# Patient Record
Sex: Female | Born: 1960 | Hispanic: No | Smoking: Former smoker
Health system: Southern US, Community
[De-identification: ages and names within clinical notes are randomized; demographics above are authoritative.]

---

## 2012-03-11 DIAGNOSIS — H402290 Chronic angle-closure glaucoma, unspecified eye, stage unspecified: Secondary | ICD-10-CM | POA: Insufficient documentation

## 2019-04-19 DIAGNOSIS — H401124 Primary open-angle glaucoma, left eye, indeterminate stage: Secondary | ICD-10-CM | POA: Insufficient documentation

## 2019-05-02 DIAGNOSIS — R7989 Other specified abnormal findings of blood chemistry: Secondary | ICD-10-CM | POA: Insufficient documentation

## 2019-05-04 DIAGNOSIS — K219 Gastro-esophageal reflux disease without esophagitis: Secondary | ICD-10-CM | POA: Insufficient documentation

## 2019-05-05 DIAGNOSIS — K8020 Calculus of gallbladder without cholecystitis without obstruction: Secondary | ICD-10-CM | POA: Insufficient documentation

## 2022-03-14 ENCOUNTER — Emergency Department (HOSPITAL_BASED_OUTPATIENT_CLINIC_OR_DEPARTMENT_OTHER): Payer: Medicaid Other

## 2022-03-14 ENCOUNTER — Inpatient Hospital Stay (HOSPITAL_BASED_OUTPATIENT_CLINIC_OR_DEPARTMENT_OTHER)
Admission: EM | Admit: 2022-03-14 | Discharge: 2022-03-18 | DRG: 065 | Disposition: A | Discharge status: Rehabilitation Facility | Payer: Medicaid Other | Attending: Family Medicine | Admitting: Family Medicine

## 2022-03-14 ENCOUNTER — Other Ambulatory Visit: Payer: Self-pay

## 2022-03-14 ENCOUNTER — Encounter (HOSPITAL_BASED_OUTPATIENT_CLINIC_OR_DEPARTMENT_OTHER): Payer: Self-pay

## 2022-03-14 ENCOUNTER — Inpatient Hospital Stay (HOSPITAL_COMMUNITY): Payer: Medicaid Other

## 2022-03-14 DIAGNOSIS — R29712 NIHSS score 12: Secondary | ICD-10-CM | POA: Diagnosis present

## 2022-03-14 DIAGNOSIS — H5461 Unqualified visual loss, right eye, normal vision left eye: Secondary | ICD-10-CM | POA: Diagnosis present

## 2022-03-14 DIAGNOSIS — G8191 Hemiplegia, unspecified affecting right dominant side: Secondary | ICD-10-CM | POA: Diagnosis present

## 2022-03-14 DIAGNOSIS — R7401 Elevation of levels of liver transaminase levels: Secondary | ICD-10-CM | POA: Diagnosis present

## 2022-03-14 DIAGNOSIS — E785 Hyperlipidemia, unspecified: Secondary | ICD-10-CM | POA: Diagnosis present

## 2022-03-14 DIAGNOSIS — I161 Hypertensive emergency: Secondary | ICD-10-CM | POA: Diagnosis present

## 2022-03-14 DIAGNOSIS — E669 Obesity, unspecified: Secondary | ICD-10-CM | POA: Diagnosis not present

## 2022-03-14 DIAGNOSIS — H547 Unspecified visual loss: Secondary | ICD-10-CM

## 2022-03-14 DIAGNOSIS — Z9049 Acquired absence of other specified parts of digestive tract: Secondary | ICD-10-CM | POA: Diagnosis not present

## 2022-03-14 DIAGNOSIS — H4010X0 Unspecified open-angle glaucoma, stage unspecified: Secondary | ICD-10-CM | POA: Diagnosis not present

## 2022-03-14 DIAGNOSIS — Z87891 Personal history of nicotine dependence: Secondary | ICD-10-CM | POA: Diagnosis not present

## 2022-03-14 DIAGNOSIS — R270 Ataxia, unspecified: Secondary | ICD-10-CM | POA: Diagnosis present

## 2022-03-14 DIAGNOSIS — Z79899 Other long term (current) drug therapy: Secondary | ICD-10-CM | POA: Diagnosis not present

## 2022-03-14 DIAGNOSIS — H401120 Primary open-angle glaucoma, left eye, stage unspecified: Secondary | ICD-10-CM | POA: Diagnosis present

## 2022-03-14 DIAGNOSIS — Z7984 Long term (current) use of oral hypoglycemic drugs: Secondary | ICD-10-CM | POA: Diagnosis not present

## 2022-03-14 DIAGNOSIS — E119 Type 2 diabetes mellitus without complications: Secondary | ICD-10-CM | POA: Diagnosis present

## 2022-03-14 DIAGNOSIS — Z7983 Long term (current) use of bisphosphonates: Secondary | ICD-10-CM

## 2022-03-14 DIAGNOSIS — I629 Nontraumatic intracranial hemorrhage, unspecified: Principal | ICD-10-CM

## 2022-03-14 DIAGNOSIS — Z91148 Patient's other noncompliance with medication regimen for other reason: Secondary | ICD-10-CM

## 2022-03-14 DIAGNOSIS — I679 Cerebrovascular disease, unspecified: Secondary | ICD-10-CM | POA: Diagnosis not present

## 2022-03-14 DIAGNOSIS — R4701 Aphasia: Secondary | ICD-10-CM | POA: Diagnosis present

## 2022-03-14 DIAGNOSIS — R29810 Facial weakness: Secondary | ICD-10-CM | POA: Diagnosis present

## 2022-03-14 DIAGNOSIS — R471 Dysarthria and anarthria: Secondary | ICD-10-CM | POA: Diagnosis present

## 2022-03-14 DIAGNOSIS — I10 Essential (primary) hypertension: Secondary | ICD-10-CM | POA: Diagnosis present

## 2022-03-14 DIAGNOSIS — I610 Nontraumatic intracerebral hemorrhage in hemisphere, subcortical: Principal | ICD-10-CM | POA: Diagnosis present

## 2022-03-14 DIAGNOSIS — Z713 Dietary counseling and surveillance: Secondary | ICD-10-CM | POA: Diagnosis not present

## 2022-03-14 DIAGNOSIS — E1169 Type 2 diabetes mellitus with other specified complication: Secondary | ICD-10-CM | POA: Diagnosis not present

## 2022-03-14 DIAGNOSIS — I619 Nontraumatic intracerebral hemorrhage, unspecified: Secondary | ICD-10-CM | POA: Diagnosis not present

## 2022-03-14 DIAGNOSIS — R1312 Dysphagia, oropharyngeal phase: Secondary | ICD-10-CM | POA: Diagnosis not present

## 2022-03-14 DIAGNOSIS — Z8673 Personal history of transient ischemic attack (TIA), and cerebral infarction without residual deficits: Secondary | ICD-10-CM

## 2022-03-14 DIAGNOSIS — R1310 Dysphagia, unspecified: Secondary | ICD-10-CM | POA: Diagnosis present

## 2022-03-14 DIAGNOSIS — Z83511 Family history of glaucoma: Secondary | ICD-10-CM | POA: Diagnosis not present

## 2022-03-14 DIAGNOSIS — H543 Unqualified visual loss, both eyes: Secondary | ICD-10-CM | POA: Diagnosis not present

## 2022-03-14 DIAGNOSIS — I69298 Other sequelae of other nontraumatic intracranial hemorrhage: Secondary | ICD-10-CM | POA: Diagnosis not present

## 2022-03-14 LAB — CBC
HCT: 38 % (ref 36.0–46.0)
Hemoglobin: 12.6 g/dL (ref 12.0–15.0)
MCH: 31.4 pg (ref 26.0–34.0)
MCHC: 33.2 g/dL (ref 30.0–36.0)
MCV: 94.8 fL (ref 80.0–100.0)
Platelets: 396 10*3/uL (ref 150–400)
RBC: 4.01 MIL/uL (ref 3.87–5.11)
RDW: 11.9 % (ref 11.5–15.5)
WBC: 8.1 10*3/uL (ref 4.0–10.5)
nRBC: 0 % (ref 0.0–0.2)

## 2022-03-14 LAB — GLUCOSE, CAPILLARY
Glucose-Capillary: 142 mg/dL — ABNORMAL HIGH (ref 70–99)
Glucose-Capillary: 144 mg/dL — ABNORMAL HIGH (ref 70–99)

## 2022-03-14 LAB — DIFFERENTIAL
Abs Immature Granulocytes: 0.1 10*3/uL — ABNORMAL HIGH (ref 0.00–0.07)
Basophils Absolute: 0.1 10*3/uL (ref 0.0–0.1)
Basophils Relative: 1 %
Eosinophils Absolute: 0.1 10*3/uL (ref 0.0–0.5)
Eosinophils Relative: 2 %
Immature Granulocytes: 1 %
Lymphocytes Relative: 24 %
Lymphs Abs: 1.9 10*3/uL (ref 0.7–4.0)
Monocytes Absolute: 0.4 10*3/uL (ref 0.1–1.0)
Monocytes Relative: 5 %
Neutro Abs: 5.5 10*3/uL (ref 1.7–7.7)
Neutrophils Relative %: 67 %

## 2022-03-14 LAB — APTT: aPTT: 28 seconds (ref 24–36)

## 2022-03-14 LAB — COMPREHENSIVE METABOLIC PANEL
ALT: 51 U/L — ABNORMAL HIGH (ref 0–44)
AST: 57 U/L — ABNORMAL HIGH (ref 15–41)
Albumin: 3.8 g/dL (ref 3.5–5.0)
Alkaline Phosphatase: 102 U/L (ref 38–126)
Anion gap: 9 (ref 5–15)
BUN: 11 mg/dL (ref 8–23)
CO2: 25 mmol/L (ref 22–32)
Calcium: 9.3 mg/dL (ref 8.9–10.3)
Chloride: 105 mmol/L (ref 98–111)
Creatinine, Ser: 0.84 mg/dL (ref 0.44–1.00)
GFR, Estimated: >60 mL/min (ref >60)
Glucose, Bld: 222 mg/dL — ABNORMAL HIGH (ref 70–99)
Potassium: 3.6 mmol/L (ref 3.5–5.1)
Sodium: 139 mmol/L (ref 135–145)
Total Bilirubin: 0.5 mg/dL (ref 0.3–1.2)
Total Protein: 8.6 g/dL — ABNORMAL HIGH (ref 6.5–8.1)

## 2022-03-14 LAB — CBG MONITORING, ED: Glucose-Capillary: 212 mg/dL — ABNORMAL HIGH (ref 70–99)

## 2022-03-14 LAB — HEMOGLOBIN A1C
Hgb A1c MFr Bld: 5.7 % — ABNORMAL HIGH (ref 4.8–5.6)
Mean Plasma Glucose: 116.89 mg/dL

## 2022-03-14 LAB — HIV ANTIBODY (ROUTINE TESTING W REFLEX): HIV Screen 4th Generation wRfx: NONREACTIVE

## 2022-03-14 LAB — PROTIME-INR
INR: 0.9 (ref 0.8–1.2)
Prothrombin Time: 12.3 seconds (ref 11.4–15.2)

## 2022-03-14 MED ORDER — TIMOLOL MALEATE 0.5 % OP SOLN
1.0000 [drp] | Freq: Two times a day (BID) | OPHTHALMIC | Status: DC
  Administered 2022-03-14 – 2022-03-18 (×8): 1 [drp] via OPHTHALMIC
  Filled 2022-03-14: qty 5

## 2022-03-14 MED ORDER — LABETALOL HCL 5 MG/ML IV SOLN
20.0000 mg | Freq: Once | INTRAVENOUS | Status: DC
  Filled 2022-03-14: qty 4

## 2022-03-14 MED ORDER — ACETAMINOPHEN 160 MG/5ML PO SOLN: 650.0000 mg | ORAL | Status: DC | PRN

## 2022-03-14 MED ORDER — STROKE: EARLY STAGES OF RECOVERY BOOK
Freq: Once | Status: AC
  Filled 2022-03-14: qty 1

## 2022-03-14 MED ORDER — SENNOSIDES-DOCUSATE SODIUM 8.6-50 MG PO TABS
1.0000 | ORAL_TABLET | Freq: Two times a day (BID) | ORAL | Status: DC
  Administered 2022-03-17: 1 via ORAL
  Filled 2022-03-14 (×4): qty 1

## 2022-03-14 MED ORDER — IOHEXOL 350 MG/ML SOLN
100.0000 mL | Freq: Once | INTRAVENOUS | Status: AC | PRN
Start: 2022-03-14 — End: 2022-03-14
  Administered 2022-03-14: 100 mL via INTRAVENOUS

## 2022-03-14 MED ORDER — ACETAMINOPHEN 650 MG RE SUPP
650.0000 mg | RECTAL | Status: DC | PRN
  Administered 2022-03-15: 650 mg via RECTAL
  Filled 2022-03-14: qty 1

## 2022-03-14 MED ORDER — ACETAMINOPHEN 325 MG PO TABS: 650.0000 mg | ORAL_TABLET | ORAL | Status: DC | PRN

## 2022-03-14 MED ORDER — SODIUM CHLORIDE 0.9 % IV SOLN
INTRAVENOUS | Status: DC
  Administered 2022-03-17: 1000 mL via INTRAVENOUS

## 2022-03-14 MED ORDER — INSULIN ASPART 100 UNIT/ML IJ SOLN
0.0000 [IU] | INTRAMUSCULAR | Status: DC
  Administered 2022-03-14 – 2022-03-15 (×5): 2 [IU] via SUBCUTANEOUS
  Administered 2022-03-15: 3 [IU] via SUBCUTANEOUS
  Administered 2022-03-16 (×2): 2 [IU] via SUBCUTANEOUS
  Administered 2022-03-16: 3 [IU] via SUBCUTANEOUS
  Administered 2022-03-16: 2 [IU] via SUBCUTANEOUS
  Administered 2022-03-17: 3 [IU] via SUBCUTANEOUS
  Administered 2022-03-17 (×2): 2 [IU] via SUBCUTANEOUS
  Administered 2022-03-17 (×2): 3 [IU] via SUBCUTANEOUS
  Administered 2022-03-17 – 2022-03-18 (×3): 2 [IU] via SUBCUTANEOUS
  Administered 2022-03-18: 8 [IU] via SUBCUTANEOUS
  Administered 2022-03-18: 3 [IU] via SUBCUTANEOUS

## 2022-03-14 MED ORDER — PANTOPRAZOLE SODIUM 40 MG IV SOLR
40.0000 mg | Freq: Every day | INTRAVENOUS | Status: DC
  Administered 2022-03-14 – 2022-03-15 (×2): 40 mg via INTRAVENOUS
  Filled 2022-03-14 (×2): qty 10

## 2022-03-14 MED ORDER — CLEVIDIPINE BUTYRATE 0.5 MG/ML IV EMUL
0.0000 mg/h | INTRAVENOUS | Status: DC
  Administered 2022-03-14: 14 mg/h via INTRAVENOUS
  Administered 2022-03-14: 2 mg/h via INTRAVENOUS
  Administered 2022-03-14: 14 mg/h via INTRAVENOUS
  Administered 2022-03-15: 10 mg/h via INTRAVENOUS
  Administered 2022-03-15: 6 mg/h via INTRAVENOUS
  Administered 2022-03-15: 14 mg/h via INTRAVENOUS
  Filled 2022-03-14 (×6): qty 100

## 2022-03-14 NOTE — H&P (Signed)
Neurology H&P ? ?CC: Aphasia and right sided weakness  ? ?History is obtained from: Emergency Contact  ? ?HPI: Paula Combs is a 61 y.o. female with PMHx, DM, HTN, HLD, glaucoma c/b loss of vision in the right eye, POAG of the left eye, ACOM aneurysm s/p coil ? ?Unfortunately history is very limited as patient and patient's primary caregiver (daughter) both only speak to [name], a language that we do not have interpretation services for.  Son-in-law is able to provide some history.  He reports that she did have a stroke in 2000 02/2004 with some residual difficulty walking which had gradually improved.  He is unsure of the laterality of her weakness at that time. ? ?Yesterday the entire day she was talking normally until she went to bed around 7 or 8 PM.  This morning on awakening she was unable to talk properly.  She seemed to have more difficulty speaking herself than understanding what she was being asked to do by family.  She notes that her blood pressure medications have been adjusted recently and she is supposed to be taking 2 pills of a particular medication but she was only taking 1.  He is unsure of the details of her medication as his wife manages this ? ?Regarding other recent symptoms, he notes that may be her weight has been changing some recently but it is difficult to be sure whether it is increasing or decreasing or simply fluctuating.  He also notes that she had a fever last week but this is improved. ? ?LKW: 4/28 evening ?tPA given?: No, ICH ?Premorbid modified rankin scale: 2 - 3   ?    2 - Slight disability. Able to look after own affairs without assistance, but unable to carry out all previous activities. ?    3 - Moderate disability. Requires some help, but able to walk unassisted. ? ?ICH Score:  ? Time performed: 6 PM ?GCS: 13-15 is 0 points ?Infratentorial: No.. If yes, 1 point - 0  ?Volume: <30cc is 0 points  ?Age: 61 y.o.. >80 is 1 point - 0  ?Intraventricular extension is 1 point -  0 ? ?Score:0 ? ?A Score of 0 points has a 30 day mortality of 0%. Stroke. 2001 Apr;32(4):891-7. ? ? ? ?ROS: Unable to obtain due to altered mental status and language barrier ? ?Per [facility] note:  ?PAST MEDICAL HISTORY ?Past Medical History:  ?Diagnosis Date  ? Blind hypertensive eye, right  ? Brain aneurysm  ? Branch retinal vein occlusion of right eye  ? Cataract  ? Diabetes mellitus (HCC)  ? Dry eye syndrome, bilateral  ? Glaucoma  ? Hyperlipemia  ? Hypertension  ? Long term (current) use of oral hypoglycemic drugs  ? Meibomian gland dysfunction (MGD) of both eyes  ? Stroke [facility]) 2004  ?Hx of Stroke  ? Stroke [facility])   ? ?Past Surgical History:  ?Procedure Laterality Date  ? iridotomy Bilateral 2013  ?Hx of LPI OU  ? ROBOTIC CHOLECYSTECTOMY N/A 08/06/2020  ?Procedure: CHOLECYSTECTOMY ROBOTIC; Surgeon: Lanice Schwab, MD; Location: [facility] MAIN OR; Service: General; Laterality: N/A;  ? ?FAMILY HISTORY ?Family History  ?Problem Relation Age of Onset  ? Glaucoma Mother  ? Glaucoma Father  ? Macular degeneration Neg Hx  ? Diabetes Neg Hx  ? Blindness Neg Hx  ? ?Current Outpatient Medications on File Prior to Visit (Ophthalmic Drugs)  ?Medication Sig  ? brinzolamide-brimonidine (SIMBRINZA) 1-0.2 % ophthalmic suspension Place 1 drop into both eyes 3 times  daily.  ? latanoprost (XALATAN) 0.005 % ophthalmic solution INSTILL 1 DROP IN BOTH EYES EVERY NIGHT  ? timolol (TIMOPTIC) 0.5 % ophthalmic solution Place 1 drop into both eyes 2 times daily.  ? ?Current Outpatient Medications on File Prior to Visit (Other) as of December 2022 ?Medication Sig  ? ACCU-CHEK AVIVA PLUS METER Misc U UTD  ? ACCU-CHEK AVIVA PLUS TEST STRP Strp U 1 STRIP D  ? ACCU-CHEK SOFTCLIX LANCETS lancets USE TO TEST QD  ? alendronate (FOSAMAX) 70 MG tablet Take 70 mg by mouth every 7 days.  ? amLODIPine (NORVASC) 5 MG tablet Take 5 mg by mouth daily.  ? atorvastatin (LIPITOR) 40 MG tablet Take 40 mg by mouth daily.  ? JANUVIA 100 mg tablet Take 100 mg by  mouth daily.  ? metFORMIN (GLUCOPHAGE) 1000 MG tablet Take 1,000 mg by mouth 2 times daily with meals.  ? omeprazole (PRILOSEC) 40 MG capsule Take 40 mg by mouth daily.  ? psyllium (METAMUCIL) 3.4 gram Take 1 packet by mouth daily for 5 days.   ?-We will need to be confirmed with family once wife is available (not available at the time of my conversation) ? ?Per preliminary pharmacy investigation  ?Current Outpatient Medications  ?Medication Instructions  ? alendronate (FOSAMAX) 70 mg, Oral, Weekly  ? atorvastatin (LIPITOR) 40 mg, Oral, Daily  ? irbesartan (AVAPRO) 75 mg, Oral, Daily  ? Januvia 100 mg, Oral, Daily  ? metFORMIN (GLUCOPHAGE) 1,000 mg, Oral, 2 times daily  ? omeprazole (PRILOSEC) 40 mg, Oral, Daily  ? timolol (TIMOPTIC) 0.5 % ophthalmic solution SMARTSIG:In Eye(s)  ? ? ? ?Social History: former smoker, quit 2015 ? ?Exam: ?Current vital signs: ?BP 136/80   Pulse (!) 106   Temp 97.9 ?F (36.6 ?C)   Resp 18   Ht 4\' 11"  (1.499 m)   Wt 64.4 kg   SpO2 99%   BMI 28.68 kg/m?  ?Vital signs in last 24 hours: ?Temp:  [97.9 ?F (36.6 ?C)] 97.9 ?F (36.6 ?C) (04/29 1721) ?Pulse Rate:  [76-121] 106 (04/29 1815) ?Resp:  [14-18] 18 (04/29 1815) ?BP: (136-206)/(80-114) 136/80 (04/29 1815) ?SpO2:  [93 %-100 %] 99 % (04/29 1815) ?Weight:  [64.4 kg] 64.4 kg (04/29 1611) ? ? ?Physical Exam  ?Constitutional: Appears well-developed and well-nourished.  ?Psych: Affect appropriate to situation, calm, slightly sleepy ?Eyes: Fixed and dilated right pupil ?HENT: No oropharyngeal obstruction.  ?MSK: no joint deformities.  ?Cardiovascular: Mildly tachycardic in the low 100s, regular rhythm, perfusing extremities well ?Respiratory: Effort normal, non-labored breathing ?GI: Soft.  No distension. There is no tenderness.  ?Skin: Warm dry and intact visible skin ? ?Neuro: ?Mental Status: ?Evaluation extremely limited by language barrier.  Alert.  Mimics examiner at times.  No verbal output ?Cranial Nerves: ?II: Blind right eye with  fixed and dilated pupil.  Left pupil is postsurgical but reactive sluggish 4 mm to 3 mm.  Appears to orient to stimuli in all 4 quadrants ?III,IV, VI: Tracks examiner in all quadrants. ?V: Facial sensation is symmetric to light eyelash brush ?VII: Facial movement is notable for right facial droop.  ?VIII: hearing is intact to voice ?Remainder unable to assess given mental status ?Motor: ?Able to maintain bilateral upper extremities without drift, though there is some slight weakness of the right upper extremity compared to the left.  Does not maintain either of her legs antigravity, though again her right leg appears to be weaker than the left ?Sensory: ?Appears equally reactive to tickle in all 4  extremities ?Cerebellar: ?Unable to assess secondary to patient's mental status  ?Gait:  ?Deferred in acute setting  ? ?NIHSS total 12 ?Score breakdown: 2 points for not answering questions, 2 points for not following commands (though no language barrier), 2 points for right facial droop, 3 points for left leg weakness, 3 points for right leg weakness ?Performed within 20 minutes of patient arrival to ICU ? ? ?I have reviewed labs in epic and the results pertinent to this consultation are: ? ?Basic Metabolic Panel: ?Recent Labs  ?Lab 03/14/22 ?1445  ?NA 139  ?K 3.6  ?CL 105  ?CO2 25  ?GLUCOSE 222*  ?BUN 11  ?CREATININE 0.84  ?CALCIUM 9.3  ? ? ?CBC: ?Recent Labs  ?Lab 03/14/22 ?1445  ?WBC 8.1  ?NEUTROABS 5.5  ?HGB 12.6  ?HCT 38.0  ?MCV 94.8  ?PLT 396  ? ? ?Coagulation Studies: ?Recent Labs  ?  03/14/22 ?1445  ?LABPROT 12.3  ?INR 0.9  ?  ? ? ?I have reviewed personally the images obtained: ? ?Agree with radiology: ?Acute parenchymal hemorrhage involving the left basal ganglia with ?mild edema. No intraventricular extension or significant mass ?effect. No abnormal vascularity in the region of hemorrhage. ?   ?Coiled anterior communicating artery region aneurysm. No definite ?residual or recurrent aneurysm. ?  ?No  hemodynamically significant stenosis in the neck. ?  ?Plaque causing mild to moderate stenosis of the left M1/M2 MCA as ?the vessel curves posteriorly. ?  ?Nonspecific extensive paranasal sinus opacification and bilateral ?patchy

## 2022-03-14 NOTE — ED Notes (Addendum)
On assessment, family is at bedside. Pt is slow to respond but will follow simple commands. Pt will move legs and arms, squeeze my hand with equal grip. Symptoms noted at this time is Pt is mute and obvious right sided facial droop is noted. Interpretor for [name] is not available at this time. Family reports LKN last night before bed. Family states she woke up this morning "looking different". Family is poor historian, unknown if pt is on blood thinners but reports hx of stroke, unsure of baseline. ? ?Unable to perform a swallow study at this time due to Pt not following command.  ?

## 2022-03-14 NOTE — ED Triage Notes (Signed)
Pt arrives pov, to triage in wheelchair, son-in-law reports facial droop and slurred speech upon waking at 1000 today. Last normal last night, before midnight. ?Interpreter unavailable for [name] language ?

## 2022-03-14 NOTE — ED Provider Notes (Signed)
?[facility] EMERGENCY DEPARTMENT ?Provider Note ? ? ?CSN: [id number] ?Arrival date & time: 03/14/22  1359 ? ?  ? ?History ? ?Chief Complaint  ?Patient presents with  ? Facial Droop  ? ? ?Paula Combs Record is a 61 y.o. female here presenting with facial droop and trouble speaking.  Her last normal was 10 PM last night when she went to bed.  She got up around 8 AM and did not feel right.  Patient states that she has trouble speaking.  The daughter woke up and talk to her around 11 am or 12 PM and her speech was slurred at that time.  She did not contact anybody between 8 AM and 11 AM when she talked to her daughter.  Her daughter then woke up her son-in-law to bring her to the ER.  She does not speak any [place] and son-in-law is at bedside translating.  He states that her speech is so slurred that he does not even understand her that well.  She had a stroke many years ago but not on any blood thinners right now ? ? ?The history is provided by the patient.  ? ?  ? ?Home Medications ?Prior to Admission medications   ?Not on File  ?   ? ?Allergies    ?Patient has no allergy information on record.   ? ?Review of Systems   ?Review of Systems  ?Neurological:  Positive for speech difficulty and weakness.  ?All other systems reviewed and are negative. ? ?Physical Exam ?Updated Vital Signs ?BP (!) 163/99   Pulse 85   Temp 97.9 ?F (36.6 ?C) (Oral)   Resp 18   Ht 4\' 11"  (1.499 m)   Wt 64.4 kg   SpO2 96%   BMI 28.68 kg/m?  ?Physical Exam ?Vitals and nursing note reviewed.  ?Constitutional:   ?   Comments: Confused  ?HENT:  ?   Head: Normocephalic.  ?   Nose: Nose normal.  ?   Mouth/Throat:  ?   Mouth: Mucous membranes are moist.  ?Eyes:  ?   Extraocular Movements: Extraocular movements intact.  ?   Pupils: Pupils are equal, round, and reactive to light.  ?Cardiovascular:  ?   Rate and Rhythm: Normal rate and regular rhythm.  ?   Pulses: Normal pulses.  ?   Heart sounds: Normal heart sounds.  ?Pulmonary:  ?   Effort:  Pulmonary effort is normal.  ?   Breath sounds: Normal breath sounds.  ?Abdominal:  ?   General: Abdomen is flat.  ?   Palpations: Abdomen is soft.  ?Musculoskeletal:     ?   General: Normal range of motion.  ?   Cervical back: Normal range of motion and neck supple.  ?Skin: ?   General: Skin is warm.  ?   Capillary Refill: Capillary refill takes less than 2 seconds.  ?Neurological:  ?   Comments: Obvious right facial droop.  Patient does have a slurred speech.  Difficult to get a good neuro exam due to language barrier and difficulty following commands.  Patient appears to have 4 out of 5 right arm and leg strength and 5 out of 5 left arm and leg strength.  Difficult to assess finger-to-nose  ?Psychiatric:     ?   Mood and Affect: Mood normal.     ?   Behavior: Behavior normal.     ?   Thought Content: Thought content normal.  ? ? ?ED Results / Procedures / Treatments   ?  Labs ?(all labs ordered are listed, but only abnormal results are displayed) ?Labs Reviewed  ?DIFFERENTIAL - Abnormal; Notable for the following components:  ?    Result Value  ? Abs Immature Granulocytes 0.10 (*)   ? All other components within normal limits  ?COMPREHENSIVE METABOLIC PANEL - Abnormal; Notable for the following components:  ? Glucose, Bld 222 (*)   ? Total Protein 8.6 (*)   ? AST 57 (*)   ? ALT 51 (*)   ? All other components within normal limits  ?CBG MONITORING, ED - Abnormal; Notable for the following components:  ? Glucose-Capillary 212 (*)   ? All other components within normal limits  ?PROTIME-INR  ?APTT  ?CBC  ?CBG MONITORING, ED  ? ? ?EKG ?EKG Interpretation ? ?Date/Time:  Saturday March 14 2022 14:14:06 EDT ?Ventricular Rate:  84 ?PR Interval:  184 ?QRS Duration: 86 ?QT Interval:  398 ?QTC Calculation: 470 ?R Axis:   -40 ?Text Interpretation: Normal sinus rhythm Left axis deviation Nonspecific T wave abnormality Abnormal ECG No previous ECGs available Confirmed by Richardean Canal [phone]) on 03/14/2022 2:55:55  PM ? ?Radiology ?CT ANGIO HEAD NECK W WO CM ? ?Result Date: 03/14/2022 ?CLINICAL DATA:  Dizziness, persistent/recurrent, cardiac or vascular cause suspected Stroke/TIA, determine embolic source EXAM: CT ANGIOGRAPHY HEAD AND NECK TECHNIQUE: Multidetector CT imaging of the head and neck was performed using the standard protocol during bolus administration of intravenous contrast. Multiplanar CT image reconstructions and MIPs were obtained to evaluate the vascular anatomy. Carotid stenosis measurements (when applicable) are obtained utilizing NASCET criteria, using the distal internal carotid diameter as the denominator. RADIATION DOSE REDUCTION: This exam was performed according to the departmental dose-optimization program which includes automated exposure control, adjustment of the Zierra and/or kV according to patient size and/or use of iterative reconstruction technique. CONTRAST:  OMNIPAQUE IOHEXOL 350 MG/ML SOLN COMPARISON:  None. FINDINGS: CT HEAD Brain: Acute parenchymal hemorrhage centered within the left lentiform nucleus measuring about 2.1 x 1.5 x 2.1 cm. There is some ill-defined hemorrhage tracking superiorly toward the caudate. Mild surrounding edema. No significant mass effect. No evidence of intraventricular extension. Prominence of ventricles and sulci reflects parenchymal volume loss. Patchy and confluent hypoattenuation in the supratentorial white matter is nonspecific but probably reflects moderate chronic microvascular ischemic changes. Vascular: Streak artifact form coil embolization in the anterior communicating artery region. Skull: Unremarkable. Sinuses/Orbits: Partially imaged extensive paranasal sinus opacification with chronic maxillary mucoperiosteal thickening. Unremarkable orbits. Other: Bilateral patchy mastoid and middle ear opacification. Review of the MIP images confirms the above findings CTA NECK Aortic arch: Trace calcified plaque. Great vessel origins are patent Right carotid  system: Patent.  No stenosis. Left carotid system: Patent.  No stenosis. Vertebral arteries: Patent and codominant.  No stenosis. Skeleton: Degenerative changes at C5-C6 and C6-C7. Other neck: Unremarkable. Upper chest: No apical lung mass. Review of the MIP images confirms the above findings CTA HEAD Anterior circulation: Intracranial internal carotid arteries are patent. Anterior cerebral arteries are patent. 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 Anterior communicating artery region is partially obscured by artifact. No definite residual or recurrent aneurysm. Middle cerebral arteries are patent. There is eccentric noncalcified plaque along the left M1/M2 MCA as it curves posteriorly causing mild to moderate stenosis. There is no abnormal vascularity in the region hemorrhage. Posterior circulation: Intracranial vertebral arteries, basilar artery, and posterior cerebral arteries are patent. Venous sinuses: Patent as allowed by contrast bolus timing. Review of the MIP  images confirms the above findings IMPRESSION: Acute parenchymal hemorrhage involving the left basal ganglia with mild edema. No intraventricular extension or significant mass effect. No abnormal vascularity in the region of hemorrhage. These results were called by telephone at the time of interpretation on 03/14/2022 at 4:02 pm to provider Earsie Humm , who verbally acknowledged these results. Coiled anterior communicating artery region aneurysm. No definite residual or recurrent aneurysm. No hemodynamically significant stenosis in the neck. Plaque causing mild to moderate stenosis of the left M1/M2 MCA as the vessel curves posteriorly. Nonspecific extensive paranasal sinus opacification and bilateral patchy mastoid and middle ear opacification. Electronically Signed   By: Guadlupe SpanishPraneil  Patel M.D.   On: 03/14/2022 16:12   ? ?Procedures ?Procedures  ? ? ?CRITICAL CARE ?Performed by: Richardean Canalavid H Harshita Bernales ? ? ?Total critical care time: 30  minutes ? ?Critical care time was exclusive of separately billable procedures and treating other patients. ? ?Critical care was necessary to treat or prevent imminent or life-threatening deterioration. ? ?Critical care was tim

## 2022-03-14 NOTE — ED Notes (Signed)
Family at bedside states pt has had difficulty hearing for the last several days. Today when she woke up, family noticed she had the facial droop and has not talked today. Pt able to move all four extremities. Will shake her head yes or no.  ?

## 2022-03-14 NOTE — Progress Notes (Signed)
Neurology phone note ? ?Contacted by Dr. Silverio Lay regarding this patient who presented with mild R sided weakness and difficulty speaking LKW 10pm last night. Of note she speaks [name], and we do not have translation available for that language, but family is at bedside to assist. Personal review of CT head showed acute IPH L BG. She has hx coiled acomm aneurysm, CTA today showed no residual aneurysm there or aneurysm elsewhere. Patient is not on anticoagulation and does not require reversal. ?  ?Additional recommendations: ?- 4N ICU has available bed. I placed transfer order and arranged transport through [facility], and signed out to accepting MD Dr. Iver Nestle by phone. ?- Clevidipine for goal SBP <150 (ordered) ?- CTA pending ?- SCDs for DVT prophylaxis ?- Head CT q 6 hrs assess stability of ICH ?- HOB elevated 30 degrees ?- Further mgmt per [facility] neurohospitalist after arrival to [facility]. I am available by pager to facilitate transfer as well. ?  ?Bing Neighbors, MD ?[facility] ?[phone] ?  ?If 7pm- 7am, please page neurology on call as listed in [facility]. ?  ?

## 2022-03-14 NOTE — ED Notes (Signed)
Pt report given to oncoming RN Crystal. All question and concerns addressed ?

## 2022-03-15 ENCOUNTER — Encounter (HOSPITAL_COMMUNITY): Payer: Self-pay | Admitting: Neurology

## 2022-03-15 ENCOUNTER — Inpatient Hospital Stay (HOSPITAL_COMMUNITY): Payer: Medicaid Other

## 2022-03-15 DIAGNOSIS — I161 Hypertensive emergency: Secondary | ICD-10-CM | POA: Diagnosis not present

## 2022-03-15 DIAGNOSIS — I629 Nontraumatic intracranial hemorrhage, unspecified: Secondary | ICD-10-CM | POA: Diagnosis not present

## 2022-03-15 LAB — ECHOCARDIOGRAM COMPLETE
AR max vel: 2.54 cm2
AV Area VTI: 2.46 cm2
AV Area mean vel: 2.5 cm2
AV Mean grad: 3 mmHg
AV Peak grad: 5.9 mmHg
Ao pk vel: 1.21 m/s
Area-P 1/2: 5.97 cm2
Calc EF: 56.5 %
Height: 59 in
S' Lateral: 1.9 cm
Single Plane A2C EF: 49.3 %
Single Plane A4C EF: 60.6 %
Weight: 2049.4 oz

## 2022-03-15 LAB — GLUCOSE, CAPILLARY
Glucose-Capillary: 114 mg/dL — ABNORMAL HIGH (ref 70–99)
Glucose-Capillary: 130 mg/dL — ABNORMAL HIGH (ref 70–99)
Glucose-Capillary: 138 mg/dL — ABNORMAL HIGH (ref 70–99)
Glucose-Capillary: 140 mg/dL — ABNORMAL HIGH (ref 70–99)
Glucose-Capillary: 150 mg/dL — ABNORMAL HIGH (ref 70–99)
Glucose-Capillary: 165 mg/dL — ABNORMAL HIGH (ref 70–99)

## 2022-03-15 LAB — COMPREHENSIVE METABOLIC PANEL
ALT: 47 U/L — ABNORMAL HIGH (ref 0–44)
AST: 41 U/L (ref 15–41)
Albumin: 3.8 g/dL (ref 3.5–5.0)
Alkaline Phosphatase: 97 U/L (ref 38–126)
Anion gap: 11 (ref 5–15)
BUN: 8 mg/dL (ref 8–23)
CO2: 20 mmol/L — ABNORMAL LOW (ref 22–32)
Calcium: 9.1 mg/dL (ref 8.9–10.3)
Chloride: 107 mmol/L (ref 98–111)
Creatinine, Ser: 0.64 mg/dL (ref 0.44–1.00)
GFR, Estimated: >60 mL/min (ref >60)
Glucose, Bld: 146 mg/dL — ABNORMAL HIGH (ref 70–99)
Potassium: 3.3 mmol/L — ABNORMAL LOW (ref 3.5–5.1)
Sodium: 138 mmol/L (ref 135–145)
Total Bilirubin: 0.5 mg/dL (ref 0.3–1.2)
Total Protein: 8.2 g/dL — ABNORMAL HIGH (ref 6.5–8.1)

## 2022-03-15 LAB — LIPID PANEL
Cholesterol: 246 mg/dL — ABNORMAL HIGH (ref 0–200)
HDL: 38 mg/dL — ABNORMAL LOW (ref >40)
LDL Cholesterol: 153 mg/dL — ABNORMAL HIGH (ref 0–99)
Total CHOL/HDL Ratio: 6.5 RATIO
Triglycerides: 275 mg/dL — ABNORMAL HIGH (ref <150)
VLDL: 55 mg/dL — ABNORMAL HIGH (ref 0–40)

## 2022-03-15 LAB — CBC WITH DIFFERENTIAL/PLATELET
Abs Immature Granulocytes: 0.1 10*3/uL — ABNORMAL HIGH (ref 0.00–0.07)
Basophils Absolute: 0.1 10*3/uL (ref 0.0–0.1)
Basophils Relative: 1 %
Eosinophils Absolute: 0.2 10*3/uL (ref 0.0–0.5)
Eosinophils Relative: 2 %
HCT: 38.4 % (ref 36.0–46.0)
Hemoglobin: 13.2 g/dL (ref 12.0–15.0)
Immature Granulocytes: 1 %
Lymphocytes Relative: 28 %
Lymphs Abs: 2.1 10*3/uL (ref 0.7–4.0)
MCH: 32.3 pg (ref 26.0–34.0)
MCHC: 34.4 g/dL (ref 30.0–36.0)
MCV: 93.9 fL (ref 80.0–100.0)
Monocytes Absolute: 0.4 10*3/uL (ref 0.1–1.0)
Monocytes Relative: 5 %
Neutro Abs: 4.6 10*3/uL (ref 1.7–7.7)
Neutrophils Relative %: 63 %
Platelets: 397 10*3/uL (ref 150–400)
RBC: 4.09 MIL/uL (ref 3.87–5.11)
RDW: 11.9 % (ref 11.5–15.5)
WBC: 7.3 10*3/uL (ref 4.0–10.5)
nRBC: 0 % (ref 0.0–0.2)

## 2022-03-15 MED ORDER — CHLORHEXIDINE GLUCONATE CLOTH 2 % EX PADS
6.0000 | MEDICATED_PAD | Freq: Every day | CUTANEOUS | Status: DC
  Administered 2022-03-15 – 2022-03-17 (×3): 6 via TOPICAL

## 2022-03-15 MED ORDER — POTASSIUM CHLORIDE 10 MEQ/100ML IV SOLN
10.0000 meq | INTRAVENOUS | Status: AC
  Administered 2022-03-15 (×4): 10 meq via INTRAVENOUS
  Filled 2022-03-15 (×4): qty 100

## 2022-03-15 NOTE — Progress Notes (Addendum)
STROKE TEAM PROGRESS NOTE  ? ?INTERVAL HISTORY ?Paula Combs is a 61 y.o. 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 CTH notable for IPH of the L basal ganglia without midline shift. ICH score of 0. SBP on arrival of 175-182. ? ?The patient is seen in her room this morning with her son-in-law at the bedside. He provides interpretation for the patient. The history is confirmed as above. Patient is alert and partially oriented. Able to follow commands. Per bedside RN, patient did not pass swallow screen d/t lethargy. ? ?Vitals:  ? 03/15/22 0845 03/15/22 0900 03/15/22 0915 03/15/22 0930  ?BP: 133/81 (!) 142/80 139/81 137/83  ?Pulse: 97 95 95 94  ?Resp: 20 18 19 18   ?Temp:      ?TempSrc:      ?SpO2: 96% 94% 95% 96%  ?Weight:      ?Height:      ? ?CBC:  ?Recent Labs  ?Lab 03/14/22 ?1445 03/15/22 ?0140  ?WBC 8.1 7.3  ?NEUTROABS 5.5 4.6  ?HGB 12.6 13.2  ?HCT 38.0 38.4  ?MCV 94.8 93.9  ?PLT 396 397  ? ?Basic Metabolic Panel:  ?Recent Labs  ?Lab 03/14/22 ?1445 03/15/22 ?0140  ?NA 139 138  ?K 3.6 3.3*  ?CL 105 107  ?CO2 25 20*  ?GLUCOSE 222* 146*  ?BUN 11 8  ?CREATININE 0.84 0.64  ?CALCIUM 9.3 9.1  ? ?Lipid Panel:  ?Recent Labs  ?Lab 03/15/22 ?0140  ?CHOL 246*  ?TRIG 275*  ?HDL 38*  ?CHOLHDL 6.5  ?VLDL 55*  ?LDLCALC 153*  ? ?HgbA1c:  ?Recent Labs  ?Lab 03/14/22 ?2038  ?HGBA1C 5.7*  ? ?Urine Drug Screen: No results for input(s): LABOPIA, COCAINSCRNUR, LABBENZ, AMPHETMU, THCU, LABBARB in the last 168 hours.  ?Alcohol Level No results for input(s): ETH in the last 168 hours. ? ?IMAGING past 24 hours ?CT ANGIO HEAD NECK W WO CM ? ?Result Date: 03/14/2022 ?CLINICAL DATA:  Dizziness, persistent/recurrent, cardiac or vascular cause suspected Stroke/TIA, determine embolic source EXAM: CT ANGIOGRAPHY HEAD AND NECK TECHNIQUE: Multidetector CT imaging of the head and neck was performed  using the standard protocol during bolus administration of intravenous contrast. Multiplanar CT image reconstructions and MIPs were obtained to evaluate the vascular anatomy. Carotid stenosis measurements (when applicable) are obtained utilizing NASCET criteria, using the distal internal carotid diameter as the denominator. RADIATION DOSE REDUCTION: This exam was performed according to the departmental dose-optimization program which includes automated exposure control, adjustment of the [place] and/or kV according to patient size and/or use of iterative reconstruction technique. CONTRAST:  100mL OMNIPAQUE IOHEXOL 350 MG/ML SOLN COMPARISON:  None. FINDINGS: CT HEAD Brain: Acute parenchymal hemorrhage centered within the left lentiform nucleus measuring about 2.1 x 1.5 x 2.1 cm. There is some ill-defined hemorrhage tracking superiorly toward the caudate. Mild surrounding edema. No significant mass effect. No evidence of intraventricular extension. Prominence of ventricles and sulci reflects parenchymal volume loss. Patchy and confluent hypoattenuation in the supratentorial white matter is nonspecific but probably reflects moderate chronic microvascular ischemic changes. Vascular: Streak artifact form coil embolization in the anterior communicating artery region. Skull: Unremarkable. Sinuses/Orbits: Partially imaged extensive paranasal sinus opacification with chronic maxillary mucoperiosteal thickening. Unremarkable orbits. Other: Bilateral patchy mastoid and middle ear opacification. Review of the MIP images confirms the above findings CTA NECK Aortic arch: Trace calcified plaque. Great vessel  origins are patent Right carotid system: Patent.  No stenosis. Left carotid system: Patent.  No stenosis. Vertebral arteries: Patent and codominant.  No stenosis. Skeleton: Degenerative changes at C5-C6 and C6-C7. Other neck: Unremarkable. Upper chest: No apical lung mass. Review of the MIP images confirms the above findings CTA  HEAD Anterior circulation: Intracranial internal carotid arteries are patent. Anterior cerebral arteries are patent. 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 Anterior communicating artery region is partially obscured by artifact. No definite residual or recurrent aneurysm. Middle cerebral arteries are patent. There is eccentric noncalcified plaque along the left M1/M2 MCA as it curves posteriorly causing mild to moderate stenosis. There is no abnormal vascularity in the region hemorrhage. Posterior circulation: Intracranial vertebral arteries, basilar artery, and posterior cerebral arteries are patent. Venous sinuses: Patent as allowed by contrast bolus timing. Review of the MIP images confirms the above findings IMPRESSION: Acute parenchymal hemorrhage involving the left basal ganglia with mild edema. No intraventricular extension or significant mass effect. No abnormal vascularity in the region of hemorrhage. These results were called by telephone at the time of interpretation on 03/14/2022 at 4:02 pm to provider DAVID YAO , who verbally acknowledged these results. Coiled anterior communicating artery region aneurysm. No definite residual or recurrent aneurysm. No hemodynamically significant stenosis in the neck. Plaque causing mild to moderate stenosis of the left M1/M2 MCA as the vessel curves posteriorly. Nonspecific extensive paranasal sinus opacification and bilateral patchy mastoid and middle ear opacification. Electronically Signed   By: Guadlupe Spanish M.D.   On: 03/14/2022 16:12  ? ?CT HEAD WO CONTRAST ( ) ? ?Result Date: 03/15/2022 ?CLINICAL DATA:  Intracranial hemorrhage EXAM: CT HEAD WITHOUT CONTRAST TECHNIQUE: Contiguous axial images were obtained from the base of the skull through the vertex without intravenous contrast. RADIATION DOSE REDUCTION: This exam was performed according to the departmental dose-optimization program which includes automated exposure control,  adjustment of the [place] and/or kV according to patient size and/or use of iterative reconstruction technique. COMPARISON:  03/14/2022 FINDINGS: Brain: Redemonstrated hyperdense hemorrhage centered in the left lentiform nucleus, measuring 1.9 x 1.5 x 2.0 cm (AP x TR x CC) (series 3, image 19 and series 5, image 31), previously 2.0 x 1.5 x 1.9 cm overall unchanged. Again noted is ill-defined hemorrhage tracking towards the caudate. Unchanged mild surrounding edema, without evidence of intraventricular extension, significant mass effect, or midline shift. No acute infarct, mass, hydrocephalus, or extra-axial collection. Periventricular white matter changes, likely the sequela of chronic small vessel ischemic disease. Vascular: Prior A-comm aneurysm coiling.  No hyperdense vessel. Skull: No acute osseous abnormality. Sinuses/Orbits: Near-complete opacification of the entirety of the paranasal sinuses with osseous thickening, compatible with chronic sinusitis. The orbits are unremarkable. Other: Fluid in bilateral mastoid air cells. IMPRESSION: Unchanged acute intraparenchymal hemorrhage involving the left basal ganglia, with unchanged surrounding mild edema without significant mass effect or midline shift. No intraventricular extension. Electronically Signed   By: Wiliam Ke M.D.   On: 03/15/2022 02:43  ? ?US Abdomen Limited RUQ (LIVER/GB) ? ?Result Date: 03/14/2022 ?CLINICAL DATA:  Abnormal liver function tests EXAM: ULTRASOUND ABDOMEN LIMITED RIGHT UPPER QUADRANT COMPARISON:  None. FINDINGS: Gallbladder: Not seen, possibly suggesting previous cholecystectomy. Common bile duct: Diameter: 4.6 mm Liver: There is increased echogenicity in the liver. No focal abnormality is seen in the visualized portions of liver. Portal vein is patent on color Doppler imaging with normal direction of blood flow towards the liver. Other: None. IMPRESSION: Gallbladder is not seen  suggesting cholecystectomy.  Fatty liver. Electronically  Signed   By: Ernie Avena M.D.   On: 03/14/2022 20:48   ? ?PHYSICAL EXAM ? ?Physical Exam  ?Constitutional: Appears well-developed and well-nourished.  ?Psych: Affect appropriate to situation ?Eyes: No s

## 2022-03-15 NOTE — Evaluation (Signed)
Physical Therapy Evaluation ?Patient Details ?Name: Paula Combs ?MRN: [id number] ?DOB: 05/21/1961 ?Today's Date: 03/15/2022 ? ?History of Present Illness ? The pt is a 61 yo female presenting 4/29 with slurred speech and R-sided facial droop. Imaging revealed acute intraparenchymal hemorrhage of L basal ganglia.   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 ?  ?Clinical Impression ? Pt in bed upon arrival of PT, agreeable to evaluation at this time. The pt was unable to contribute to history or PLOF as no family present and the interpreter service does not have her dialect [place]). The pt was able to mimic movements and follow gestures to complete bed mobility, AROM of BLE and UE, and trunk movements. She required minA to complete bed mobility, and up to minA to complete sit-stand and maintain standing balance at this time. She does present with deficits in LE strength and stability as she presents with posterior lean and is dependent on BLE bracing on bed for additional support. The pt was able to manage small lateral steps along EOB with modA, but demos poor advancement of RLE and was unable to step away from EOB. Will continue to benefit from skilled PT to address these deficits, will work towards home with family assist at d/c.    ?   ? ?Recommendations for follow up therapy are one component of a multi-disciplinary discharge planning process, led by the attending physician.  Recommendations may be updated based on patient status, additional functional criteria and insurance authorization. ? ?Follow Up Recommendations Home health PT ? ?  ?Assistance Recommended at Discharge Frequent or constant Supervision/Assistance  ?Patient can return home with the following ? A lot of help with walking and/or transfers;A lot of help with bathing/dressing/bathroom;Assistance with cooking/housework;Direct supervision/assist for financial management;Direct  supervision/assist for medications management;Assistance with feeding;Assist for transportation;Help with stairs or ramp for entrance ? ?  ?Equipment Recommendations Rolling walker (2 wheels);BSC/3in1;Wheelchair (measurements PT);Wheelchair cushion (measurements PT)  ?Recommendations for Other Services ?    ?  ?Functional Status Assessment Patient has had a recent decline in their functional status and demonstrates the ability to make significant improvements in function in a reasonable and predictable amount of time.  ? ?  ?Precautions / Restrictions Precautions ?Precautions: Fall ?Restrictions ?Weight Bearing Restrictions: No  ? ?  ? ?Mobility ? Bed Mobility ?Overal bed mobility: Needs Assistance ?Bed Mobility: Supine to Sit, Sit to Supine ?  ?  ?Supine to sit: Min assist ?Sit to supine: Min assist ?  ?General bed mobility comments: minA with gestural cues to reach EOB, minA at times to manage seated balance ?  ? ?Transfers ?Overall transfer level: Needs assistance ?Equipment used: 1 person hand held assist ?Transfers: Sit to/from Stand ?Sit to Stand: Min assist ?  ?  ?  ?  ?  ?General transfer comment: minA to power up but pt bracing BLE against EOB for support, posterior lean. able to manage small lateral steps along EOB but not step away from bed. sit-stand x2 ?  ? ?Ambulation/Gait ?  ?  ?  ?  ?  ?  ?  ?General Gait Details: only small lateral steps along EOB at this time, poor advanement of RLE ? ?Modified Rankin (Stroke Patients Only) ?Modified Rankin (Stroke Patients Only) ?Pre-Morbid Rankin Score: No symptoms ?Modified Rankin: Moderately severe disability ? ?  ? ?Balance Overall balance assessment: Needs assistance ?Sitting-balance support: No upper extremity supported, Feet supported ?  Sitting balance-Leahy Scale: Poor ?Sitting balance - Comments: at times needing minA to steady ?Postural control: Posterior lean ?Standing balance support: Bilateral upper extremity supported, During functional  activity ?Standing balance-Leahy Scale: Poor ?Standing balance comment: BUE support with posterior lean and bracking bLE against bed ?  ?  ?  ?  ?  ?  ?  ?  ?  ?  ?  ?   ? ? ? ?Pertinent Vitals/Pain Pain Assessment ?Pain Assessment: Faces ?Pain Score: 0-No pain ?Pain Intervention(s): Monitored during session  ? ? ?Home Living Family/patient expects to be discharged to:: Other (Comment) ?  ?  ?  ?  ?  ?  ?  ?  ?  ?Additional Comments: no family present and no interpreter who speaks her dialect available, per chart: pt from home with a daughter who is a caregiver, has had a stroke but per family no residual deficits.  ?  ?Prior Function Prior Level of Function : Patient poor historian/Family not available ?  ?  ?  ?  ?  ?  ?  ?  ?  ? ? ?Hand Dominance  ?   ? ?  ?Extremity/Trunk Assessment  ? Upper Extremity Assessment ?Upper Extremity Assessment: Defer to OT evaluation ?  ? ?Lower Extremity Assessment ?Lower Extremity Assessment: RLE deficits/detail ?RLE Deficits / Details: grossly functional against gravity, no buckling in stance, but poor advancement with stepping along EOB ?RLE Coordination: decreased gross motor ?  ? ?Cervical / Trunk Assessment ?Cervical / Trunk Assessment: Kyphotic  ?Communication  ? Communication: Prefers language other than [place] (interpreter not available for [place] dialect, no family present)  ?Cognition Arousal/Alertness: Awake/alert ?Behavior During Therapy: Flat affect ?Overall Cognitive Status: Difficult to assess ?  ?  ?  ?  ?  ?  ?  ?  ?  ?  ?  ?  ?  ?  ?  ?  ?General Comments: pt able to follow gestural cues and mimic therapist for UE and LE movements. She did not attempt to speak or make any noise through the session. increased time to follow cues ?  ?  ? ?  ?General Comments General comments (skin integrity, edema, etc.): VSS on RA ? ?  ?   ? ?Assessment/Plan  ?  ?PT Assessment Patient needs continued PT services  ?PT Problem List Decreased range of motion;Decreased  strength;Decreased activity tolerance;Decreased balance;Decreased mobility;Decreased coordination;Decreased safety awareness ? ?   ?  ?PT Treatment Interventions DME instruction;Gait training;Stair training;Functional mobility training;Therapeutic activities;Therapeutic exercise;Balance training;Patient/family education   ? ?PT Goals (Current goals can be found in the Care Plan section)  ?Acute Rehab PT Goals ?Patient Stated Goal: none stated ?PT Goal Formulation: With patient ?Time For Goal Achievement: 03/29/22 ?Potential to Achieve Goals: Good ? ?  ?Frequency Min 4X/week ?  ? ? ?   ?AM-PAC PT "6 Clicks" Mobility  ?Outcome Measure Help needed turning from your back to your side while in a flat bed without using bedrails?: A Little ?Help needed moving from lying on your back to sitting on the side of a flat bed without using bedrails?: A Little ?Help needed moving to and from a bed to a chair (including a wheelchair)?: A Little ?Help needed standing up from a chair using your arms (e.g., wheelchair or bedside chair)?: A Lot ?Help needed to walk in hospital room?: Total ?Help needed climbing 3-5 steps with a railing? : Total ?6 Click Score: 13 ? ?  ?End of Session Equipment Utilized During Treatment:  Gait belt ?Activity Tolerance: Patient tolerated treatment well ?Patient left: in bed;with call bell/phone within reach;with bed alarm set ?Nurse Communication: Mobility status ?PT Visit Diagnosis: Other abnormalities of gait and mobility (R26.89);Muscle weakness (generalized) (M62.81) ?  ? ?Time: [id number] ?PT Time Calculation (min) (ACUTE ONLY): 21 min ? ? ?Charges:   PT Evaluation ?$PT Eval Moderate Complexity: 1 Mod ?  ?  ?   ? ? ?[place] Carbo, PT, DPT  ? ?Acute Rehabilitation Department ?Pager #: [phone] - [phone] ? ?Sandra Cockayne ?03/15/2022, 5:49 PM ? ?

## 2022-03-15 NOTE — Evaluation (Signed)
Clinical/Bedside Swallow Evaluation ?Patient Details  ?Name: Paula Combs ?MRN: [id number] ?Date of Birth: 07-07-1961 ? ?Today's Date: 03/15/2022 ?Time: SLP Start Time (ACUTE ONLY): 1345 SLP Stop Time (ACUTE ONLY): 1406 ?SLP Time Calculation (min) (ACUTE ONLY): 21 min ? ?Past Medical History: History reviewed. No pertinent past medical history. ?HPI:  ?Paula Combs is a 61 y.o. 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 CTH notable for IPH of the L basal ganglia without midline shift. ICH score of 0. SBP on arrival of 175-182.  ?  ?Assessment / Plan / Recommendation  ?Clinical Impression ? 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 Pharyngeally, coughing noted post intake with thin liquids suggestive of decreased airway protection. Discussed with patient and family (family friend present for interpretation). Will plan for instrumental exam tomorrow to more formally evaluate swallowin physiology and determine potential to initiate a po diet. ?SLP Visit Diagnosis: Dysphagia, oropharyngeal phase (R13.12) ?   ?   ?Diet Recommendation NPO  ? ?Medication Administration: Via alternative means  ?  ?Other  Recommendations Oral Care Recommendations: Oral care QID   ? ?Recommendations for follow up therapy are one component of a multi-disciplinary discharge planning process, led by the attending physician.  Recommendations may be updated based on patient status, additional functional criteria and insurance authorization. ? ?Follow up  Recommendations Acute inpatient rehab (3hours/day)  ? ? ?  ?Assistance Recommended at Discharge Frequent or constant Supervision/Assistance  ? ? ?Swallow Study   ?General HPI: Paula Combs is a 61 y.o. 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 CTH notable for IPH of the L basal ganglia without midline shift. ICH score of 0. SBP on arrival of 175-182. ?Type of Study: Bedside Swallow Evaluation ?Previous Swallow Assessment: none ?Diet Prior to this Study: NPO ?Temperature Spikes Noted: No ?Respiratory Status: Room air ?History of Recent Intubation: No ?Behavior/Cognition: Alert;Cooperative;Pleasant mood (largely non-verbal) ?Oral Cavity Assessment: Within Functional Limits ?Oral Care Completed by SLP: No ?Oral Cavity - Dentition: Adequate natural dentition ?Vision: Functional for self-feeding ?Self-Feeding Abilities: Able to feed self;Needs assist ?Patient Positioning: Upright in bed ?Baseline Vocal Quality: Low vocal intensity ?Volitional Cough: Cognitively unable to elicit ?Volitional Swallow: Unable to elicit  ?  ?Oral/Motor/Sensory Function Overall Oral Motor/Sensory Function: Moderate impairment ?Facial ROM: Reduced right;Suspected CN VII (facial) dysfunction ?Facial Symmetry: Abnormal symmetry right;Suspected CN VII (facial) dysfunction ?Facial Strength: Reduced right;Reduced left ?Facial Sensation:  (difficult to determine) ?Lingual ROM: Reduced right;Reduced left;Suspected CN XII (hypoglossal) dysfunction ?Lingual Symmetry: Within Functional Limits ?Lingual Strength: Reduced;Suspected CN XII (hypoglossal) dysfunction ?Lingual Sensation: Within Functional Limits ?Velum: Within Functional Limits ?Mandible: Within Functional Limits   ?Ice Chips Ice chips: Impaired ?Presentation: Spoon ?Oral Phase Impairments: Reduced labial seal ?Oral Phase Functional  Implications: Prolonged oral transit   ?Thin Liquid Thin Liquid: Impaired ?Presentation: Cup ?Oral Phase Impairments: Reduced labial seal;Reduced lingual movement/coordination ?Oral Phase Functional Implications:  Right anterior spillage ?Pharyngeal  Phase Impairments: Multiple swallows;Suspected delayed Swallow;Cough - Immediate  ?  ?Nectar Thick Nectar Thick Liquid: Not tested   ?Honey Thick Honey Thick Liquid: Not tested   ?Puree Puree: Impaired ?Presentation: Spoon ?Oral Phase Impairments: Reduced labial seal;Reduced lingual movement/coordination;Impaired mastication ?Oral Phase Functional Implications: Right anterior spillage;Oral residue   ?Solid ? ? ?  Solid: Not tested  ? ?  ?Paula Combs, CCC-SLP ? ?Paula Combs ?03/15/2022,2:11 PM ? ? ? ?

## 2022-03-16 ENCOUNTER — Inpatient Hospital Stay (HOSPITAL_COMMUNITY): Payer: Medicaid Other

## 2022-03-16 DIAGNOSIS — I161 Hypertensive emergency: Secondary | ICD-10-CM | POA: Diagnosis not present

## 2022-03-16 DIAGNOSIS — I629 Nontraumatic intracranial hemorrhage, unspecified: Secondary | ICD-10-CM | POA: Diagnosis not present

## 2022-03-16 LAB — GLUCOSE, CAPILLARY
Glucose-Capillary: 107 mg/dL — ABNORMAL HIGH (ref 70–99)
Glucose-Capillary: 124 mg/dL — ABNORMAL HIGH (ref 70–99)
Glucose-Capillary: 129 mg/dL — ABNORMAL HIGH (ref 70–99)
Glucose-Capillary: 135 mg/dL — ABNORMAL HIGH (ref 70–99)
Glucose-Capillary: 137 mg/dL — ABNORMAL HIGH (ref 70–99)
Glucose-Capillary: 160 mg/dL — ABNORMAL HIGH (ref 70–99)

## 2022-03-16 MED ORDER — IRBESARTAN 150 MG PO TABS
75.0000 mg | ORAL_TABLET | Freq: Every day | ORAL | Status: DC
  Administered 2022-03-16 – 2022-03-18 (×3): 75 mg via ORAL
  Filled 2022-03-16 (×3): qty 1

## 2022-03-16 MED ORDER — PANTOPRAZOLE SODIUM 40 MG PO TBEC
40.0000 mg | DELAYED_RELEASE_TABLET | Freq: Every day | ORAL | Status: DC
  Administered 2022-03-16 – 2022-03-18 (×3): 40 mg via ORAL
  Filled 2022-03-16 (×3): qty 1

## 2022-03-16 MED ORDER — LABETALOL HCL 5 MG/ML IV SOLN
10.0000 mg | INTRAVENOUS | Status: DC | PRN
  Administered 2022-03-16: 10 mg via INTRAVENOUS
  Filled 2022-03-16: qty 4

## 2022-03-16 NOTE — Progress Notes (Addendum)
STROKE TEAM PROGRESS NOTE   INTERVAL HISTORY The patient is seen in her room this morning with her son-in-law at the bedside. He provides interpretation for the patient. Patient is alert and partially oriented. Able to follow commands and states her age as "61."  Her voice is hypophonic and she does have some dysarthria.  Plan for MBS with SLP today.   Vitals:   03/16/22 0600 03/16/22 0615 03/16/22 0630 03/16/22 0800  BP: 127/86 130/87 127/88   Pulse: 95 97 96   Resp: 17 17 18    Temp:    98.9 F (37.2 C)  TempSrc:    Oral  SpO2: 95% 95% 96%   Weight:      Height:       CBC:  Recent Labs  Lab 03/14/22 1445 03/15/22 0140  WBC 8.1 7.3  NEUTROABS 5.5 4.6  HGB 12.6 13.2  HCT 38.0 38.4  MCV 94.8 93.9  PLT 396 397    Basic Metabolic Panel:  Recent Labs  Lab 03/14/22 1445 03/15/22 0140  NA 139 138  K 3.6 3.3*  CL 105 107  CO2 25 20*  GLUCOSE 222* 146*  BUN 11 8  CREATININE 0.84 0.64  CALCIUM 9.3 9.1    Lipid Panel:  Recent Labs  Lab 03/15/22 0140  CHOL 246*  TRIG 275*  HDL 38*  CHOLHDL 6.5  VLDL 55*  LDLCALC 153*    HgbA1c:  Recent Labs  Lab 03/14/22 2038  HGBA1C 5.7*    Urine Drug Screen: No results for input(s): LABOPIA, COCAINSCRNUR, LABBENZ, AMPHETMU, THCU, LABBARB in the last 168 hours.  Alcohol Level No results for input(s): ETH in the last 168 hours.  IMAGING past 24 hours ECHOCARDIOGRAM COMPLETE  Result Date: 03/15/2022    ECHOCARDIOGRAM REPORT   Patient Name:   Paula Combs Date of Exam: 03/15/2022 Medical Rec #:  [id number]   Height:       59.0 in Accession #:    [id number]  Weight:       142.0 lb Date of Birth:  07-26-1961    BSA:          1.595 m Patient Age:    61 years    BP:           133/95 mmHg Patient Gender: F           HR:           78 bpm. Exam Location:  Inpatient Procedure: 2D Echo, Cardiac Doppler and Color Doppler Indications:    CVA  History:        Patient has no prior history of Echocardiogram examinations.  Sonographer:    Neomia Dear RDCS Referring Phys: [id number] SRISHTI L BHAGAT IMPRESSIONS  1. Left ventricular ejection fraction, by estimation, is 60 to 65%. The left ventricle has normal function. The left ventricle has no regional wall motion abnormalities. There is mild concentric left ventricular hypertrophy. Left ventricular diastolic parameters are consistent with Grade I diastolic dysfunction (impaired relaxation). Elevated left ventricular end-diastolic pressure.  2. Right ventricular systolic function is normal. The right ventricular size is normal. Tricuspid regurgitation signal is inadequate for assessing PA pressure.  3. The mitral valve is normal in structure. Trivial mitral valve regurgitation. No evidence of mitral stenosis.  4. The aortic valve is normal in structure. Aortic valve regurgitation is not visualized. No aortic stenosis is present. FINDINGS  Left Ventricle: Left ventricular ejection fraction, by estimation, is 60 to 65%. The left ventricle has normal  function. The left ventricle has no regional wall motion abnormalities. The left ventricular internal cavity size was normal in size. There is  mild concentric left ventricular hypertrophy. Left ventricular diastolic parameters are consistent with Grade I diastolic dysfunction (impaired relaxation). Elevated left ventricular end-diastolic pressure. Right Ventricle: The right ventricular size is normal. No increase in right ventricular wall thickness. Right ventricular systolic function is normal. Tricuspid regurgitation signal is inadequate for assessing PA pressure. Left Atrium: Left atrial size was normal in size. Right Atrium: Right atrial size was normal in size. Pericardium: There is no evidence of pericardial effusion. Mitral Valve: The mitral valve is normal in structure. Mild mitral annular calcification. Trivial mitral valve regurgitation. No evidence of mitral valve stenosis. Tricuspid Valve: The tricuspid valve is normal in structure. Tricuspid valve  regurgitation is not demonstrated. No evidence of tricuspid stenosis. Aortic Valve: The aortic valve is normal in structure. Aortic valve regurgitation is not visualized. No aortic stenosis is present. Aortic valve mean gradient measures 3.0 mmHg. Aortic valve peak gradient measures 5.9 mmHg. Aortic valve area, by VTI measures 2.46 cm. Pulmonic Valve: The pulmonic valve was normal in structure. Pulmonic valve regurgitation is not visualized. No evidence of pulmonic stenosis. Aorta: The aortic root is normal in size and structure. IAS/Shunts: No atrial level shunt detected by color flow Doppler.  LEFT VENTRICLE PLAX 2D LVIDd:         3.40 cm     Diastology LVIDs:         1.90 cm     LV e' medial:    4.56 cm/s LV PW:         1.20 cm     LV E/e' medial:  14.6 LV IVS:        1.10 cm     LV e' lateral:   4.56 cm/s LVOT diam:     2.10 cm     LV E/e' lateral: 14.6 LV SV:         56 LV SV Index:   35 LVOT Area:     3.46 cm  LV Volumes (MOD) LV vol d, MOD A2C: 46.7 ml LV vol d, MOD A4C: 48.2 ml LV vol s, MOD A2C: 23.7 ml LV vol s, MOD A4C: 19.0 ml LV SV MOD A2C:     23.0 ml LV SV MOD A4C:     48.2 ml LV SV MOD BP:      27.3 ml RIGHT VENTRICLE RV Basal diam:  2.60 cm RV Mid diam:    2.10 cm RV S prime:     11.90 cm/s TAPSE (M-mode): 2.1 cm LEFT ATRIUM             Index        RIGHT ATRIUM           Index LA diam:        2.40 cm 1.51 cm/m   RA Area:     10.70 cm LA Vol (A2C):   52.1 ml 32.67 ml/m  RA Volume:   19.10 ml  11.98 ml/m LA Vol (A4C):   22.7 ml 14.24 ml/m LA Biplane Vol: 34.9 ml 21.89 ml/m  AORTIC VALVE                    PULMONIC VALVE AV Area (Vmax):    2.54 cm     PV Vmax:       1.03 m/s AV Area (Vmean):   2.50 cm     PV Vmean:  68.350 cm/s AV Area (VTI):     2.46 cm     PV VTI:        0.184 m AV Vmax:           121.00 cm/s  PV Peak grad:  4.2 mmHg AV Vmean:          81.400 cm/s  PV Mean grad:  2.0 mmHg AV VTI:            0.227 m AV Peak Grad:      5.9 mmHg AV Mean Grad:      3.0 mmHg LVOT Vmax:          88.80 cm/s LVOT Vmean:        58.700 cm/s LVOT VTI:          0.161 m LVOT/AV VTI ratio: 0.71  AORTA Ao Root diam: 3.40 cm Ao Asc diam:  2.90 cm MITRAL VALVE MV Area (PHT): 5.97 cm     SHUNTS MV Decel Time: 127 msec     Systemic VTI:  0.16 m MV E velocity: 66.50 cm/s   Systemic Diam: 2.10 cm MV A velocity: 116.00 cm/s MV E/A ratio:  0.57 Armanda Magic MD Electronically signed by Armanda Magic MD Signature Date/Time: 03/15/2022/2:38:05 PM    Final     PHYSICAL EXAM  Physical Exam  Constitutional: Appears well-developed and well-nourished.  Psych: Affect appropriate to situation Eyes: No scleral injection HENT: No OP obstrucion MSK: no joint deformities.  Cardiovascular: Normal rate and regular rhythm.  Respiratory: Effort normal, non-labored breathing Skin: WDL  Neuro: Mental Status: Patient is awake, alert, oriented to person and place, states age as "67" Significant delay in following commands Cranial Nerves: II: Visual Fields are full, blinks to threat bilaterally.  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 XII: Tongue protrudes midline without atrophy or fasciculations.  Motor: Tone is normal. Bulk is normal. Bilateral UE are 4/5 with good grip.  + RLE babinski Plantar and dorsi flexion slightly weaker on RLE at a 3-/5. DF/PF on LLE 3/5  BLE 3/5 Sensory: Sensation is symmetric to light touch in the arms and legs. Cerebellar: FNF with mild L hand ataxia   ASSESSMENT/PLAN Paula Combs is a 61 y.o. 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 CTH notable for IPH of the L basal ganglia without midline shift. ICH score of  0. SBP on arrival of 175-182.  Unable to obtain MRI due to aneurysm coil.  Cleviprex is off, on as needed BP meds IVP  ICH - L BG IPH likely due to hypertension CT head left BG ICH Repeat CT shows stable ICH CTA Head and neck: mild to moderate stenosis of of the left M1/M2, no AVM or aneurysm 2D Echo: EF 60 to 65% LDL 153 HgbA1c 5.7 VTE prophylaxis -SCDs No antithrombotic prior to admission, now on No antithrombotic due to IPH. Therapy recommendations: CIR Disposition:  pending  Hypertension Home meds:  irbesartan Stable on Cleviprex BP goal <160 Resume home BP meds Long-term BP goal normotensive Labetolol 10mg  q2hr PRN  Hyperlipidemia Home meds:  atorvastatin 40 mg, not resumed  in hospital given IPH LDL 153, goal < 70 Increase statin at discharge  Diabetes Home meds: metformin HgbA1c 5.7, goal < 7.0 CBGs SSI  Dysphagia Now on dysphagia 3 and thin liquid On gentle IV fluid  Other Stroke Risk Factors History of stroke in 2004-2005, no significant residual  Other acute issues Right eye blind ACOM aneurysm status post coiling 8 to 9 years ago  Code Status: Full Code    Hospital day # 2   Patient seen and examined by NP/APP with MD. MD to update note as needed.   Elmer Picker, DNP, FNP-BC [facility] Pager: [phone]  ATTENDING NOTE: I reviewed above note and agree with the assessment and plan. Pt was seen and examined.   On exam, patient son-in-law at bedside.  Patient mildly drowsy but open eyes on voice, however, significant psychomotor slowing on exam. With interpretation of son-in-law, pt orientated to place, people, but said her age "69". But very slow to answer questions. Significant hypophonia and dysarthria. Able to name 1/2. Follows most simple commands in a significantly delayed fashion. Blinking to visual threat more on the left, right eye reported blind, tracking bilaterally slow, right facial droop, tongue midline. BUEs no drift.  BLEs 3/5 proximal and distal ankle DF/PF left 3/5, right 3-/5, sensation subjectively symmetrical and coronation not quite corporative, right Babinski positive.  Patient has a swallow, started home BP meds, taper off Cleviprex.  BP goal less than 160, PT/OT recommend CIR. not able to have MRI due to previous ACOM aneurysm coiling.  For detailed assessment and plan, please refer to above as I have made changes wherever appropriate.   Marvel Plan, MD PhD Stroke Neurology 03/16/2022 6:57 PM  This patient is critically ill due to ICH, hypertensive emergency and at significant risk of neurological worsening, death form hematoma expansion, cerebral edema, seizure. This patient's care requires constant monitoring of vital signs, hemodynamics, respiratory and cardiac monitoring, review of multiple databases, neurological assessment, discussion with family, other specialists and medical decision making of high complexity. I spent 35 minutes of neurocritical care time in the care of this patient.    To contact Stroke Continuity provider, please refer to [email or web address]. After hours, contact General Neurology

## 2022-03-16 NOTE — Progress Notes (Signed)
?  Transition of Care (TOC) Screening Note ? ? ?Patient Details  ?Name: Paula Combs ?Date of Birth: 12-13-1960 ? ? ?Transition of Care [facility]) CM/SW Contact:    ?Geralynn Ochs, LCSW ?Phone Number: ?03/16/2022, 3:24 PM ? ? ? ?Transition of Care Department [facility]) has reviewed patient and no TOC needs have been identified at this time; medical workup ongoing. We will continue to monitor patient advancement through interdisciplinary progression rounds. If new patient transition needs arise, please place a TOC consult. ?  ?

## 2022-03-16 NOTE — Progress Notes (Signed)
Modified Barium Swallow Progress Note ? ?Patient Details  ?Name: Paula Combs ?MRN: [id number] ?Date of Birth: 04-Jan-1961 ? ?Today's Date: 03/16/2022 ? ?Modified Barium Swallow completed.  Full report located under Chart Review in the Imaging Section. ? ?Brief recommendations include the following: ? ?Clinical Impression ? Pt demonstrates a mild oral dysphagia which improved throughout study as more and more POs were given and pt appeared to better initaition lingual coordination. Pt initially unable to lateralize solids for mastication, but then achieved adequate mastication of nutrigrain bar with extra time. Initiation of swallowing with almost all boluses occurred at the level of the pyriform sinuses though only trace flash penetration occurred with thin liquids PAS 2. Pt may initiate a mechanical soft diet with thin liquids as long as alert and upright. SLP to f/u for tolerance. ?  ?Swallow Evaluation Recommendations ? ?   ? ? SLP Diet Recommendations: Dysphagia 3 (Mech soft) solids;Thin liquid ? ? Liquid Administration via: Cup;Straw ? ? Medication Administration: Whole meds with puree ? ? Supervision: Staff to assist with self feeding ? ? Compensations: Slow rate;Small sips/bites ? ? Postural Changes: Seated upright at 90 degrees ? ?   ? ?   ? ? ? ?Rini Moffit, Riley Nearing ?03/16/2022,2:20 PM ?

## 2022-03-16 NOTE — Progress Notes (Signed)
? ?  Inpatient Rehab Admissions Coordinator : ? ?Per therapy recommendations, patient was screened for CIR candidacy by Byron Tipping RN MSN.  At this time patient appears to be a potential candidate for CIR. I will place a rehab consult per protocol for full assessment. Please call me with any questions. ? ?Jamara Vary RN MSN ?Admissions Coordinator ?[phone] ?  ?

## 2022-03-16 NOTE — Evaluation (Signed)
Occupational Therapy Evaluation ?Patient Details ?Name: Paula Combs ?MRN: [id number] ?DOB: Aug 09, 1961 ?Today's Date: 03/16/2022 ? ? ?History of Present Illness The pt is a 61 yo female presenting 4/29 with slurred speech and R-sided facial droop. Imaging revealed acute intraparenchymal hemorrhage of L basal ganglia.   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  ? ?Clinical Impression ?  ?Paula Combs was evaluated s/p the above admission list. Per her son in law, who provided interpretation throughout, pt is generally indep at baseline, but her daughter stays home with her 24/7 if she needed assistance. She lives in a multi level home (able to stay on main level) with level entry. Upon evaluation pt was limited by impaired cognition and required significantly increased time for processing, general weakness but R more weak then L, impaired balance and poor activity tolerance. She is also blind in her R eye at baseline and does not speak [place]. Overall pt required min A +2 for mobility and up to max A for LB ADLs. Pt will benefit from OT to progress the limitations listed below. Recommend AIR at d/c for multidisciplinary approach and maximal functional recovery.  ?   ? ?Recommendations for follow up therapy are one component of a multi-disciplinary discharge planning process, led by the attending physician.  Recommendations may be updated based on patient status, additional functional criteria and insurance authorization.  ? ?Follow Up Recommendations ? Acute inpatient rehab (3hours/day)  ?  ?Assistance Recommended at Discharge Frequent or constant Supervision/Assistance  ?Patient can return home with the following A lot of help with walking and/or transfers;A lot of help with bathing/dressing/bathroom;Direct supervision/assist for medications management;Assist for transportation;Help with stairs or ramp for entrance ? ?  ?Functional Status Assessment ? Patient has  had a recent decline in their functional status and demonstrates the ability to make significant improvements in function in a reasonable and predictable amount of time.  ?Equipment Recommendations ? BSC/3in1 (RW)  ?  ?Recommendations for Other Services Rehab consult ? ? ?  ?Precautions / Restrictions Precautions ?Precautions: Fall ?Restrictions ?Weight Bearing Restrictions: No  ? ?  ? ?Mobility Bed Mobility ?Overal bed mobility: Needs Assistance ?Bed Mobility: Supine to Sit ?  ?  ?Supine to sit: Min assist ?  ?  ?General bed mobility comments: use of chuck pad to advance hips to EOB ?  ? ?Transfers ?Overall transfer level: Needs assistance ?Equipment used: 2 person hand held assist ?Transfers: Sit to/from Stand ?Sit to Stand: Min assist, +2 physical assistance, +2 safety/equipment ?  ?  ?  ?  ?  ?General transfer comment: ambulated with 2 person hand hold ~8 feet min A +2 for balance and safety ?  ? ?  ?Balance Overall balance assessment: Needs assistance ?Sitting-balance support: No upper extremity supported, Feet supported ?Sitting balance-Leahy Scale: Fair ?Sitting balance - Comments: min G for safety ?  ?Standing balance support: Bilateral upper extremity supported, During functional activity ?Standing balance-Leahy Scale: Poor ?Standing balance comment: statically stands with 1 person hand hole, ambulates with 2 person hand hold ?  ?  ?  ?  ?  ?  ?  ?  ?  ?  ?  ?   ? ?ADL either performed or assessed with clinical judgement  ? ?ADL Overall ADL's : Needs assistance/impaired ?Eating/Feeding: NPO ?  ?Grooming: Moderate assistance;Sitting ?  ?Upper Body Bathing: Moderate assistance;Sitting ?  ?Lower Body Bathing: Maximal assistance;Sit to/from stand ?  ?  Upper Body Dressing : Moderate assistance;Sitting ?  ?Lower Body Dressing: Maximal assistance;Sit to/from stand ?  ?Toilet Transfer: Minimal assistance;+2 for physical assistance;+2 for safety/equipment ?Toilet Transfer Details (indicate cue type and reason): 2  person hand hold ?Toileting- Clothing Manipulation and Hygiene: Minimal assistance;Sitting/lateral lean ?  ?  ?  ?Functional mobility during ADLs: Minimal assistance;+2 for safety/equipment;+2 for physical assistance ?General ADL Comments: limited by language barrier, global weakness, slow processing  ? ? ? ?Vision Baseline Vision/History: 2 Legally blind ?Ability to See in Adequate Light: 1 Impaired ?Patient Visual Report: Other (comment) (difficult to assess, R eye blind at baseline) ?Vision Assessment?: Vision impaired- to be further tested in functional context ?Additional Comments: difficulty following commands for vision assessment. Blind to threat on L. R eye blind at baseline  ?   ?Perception   ?  ?Praxis   ?  ? ?Pertinent Vitals/Pain Pain Assessment ?Pain Assessment: Faces ?Faces Pain Scale: Hurts a little bit ?Pain Location: unsure ?Pain Descriptors / Indicators: Grimacing ?Pain Intervention(s): Limited activity within patient's tolerance, Monitored during session  ? ? ? ?Hand Dominance Right ?  ?Extremity/Trunk Assessment Upper Extremity Assessment ?Upper Extremity Assessment: RUE deficits/detail;LUE deficits/detail ?RUE Deficits / Details: difficult to fully assess due to language barrier. AROM is WFL. Strength is globally week ~3/5 MMT, slow and deliberate. pt denies sensation changes ?RUE Sensation: WNL ?RUE Coordination: decreased fine motor;decreased gross motor ?LUE Deficits / Details: difficult to fully assess due to language barrier. AROM is WFL but slow and deliberate. Strength is globally week ~3+/5 MMT. pt denies sensation changes ?LUE Sensation: WNL ?LUE Coordination: decreased fine motor;decreased gross motor ?  ?Lower Extremity Assessment ?Lower Extremity Assessment: Defer to PT evaluation ?  ?Cervical / Trunk Assessment ?Cervical / Trunk Assessment: Kyphotic (mild) ?  ?Communication Communication ?Communication: Prefers language other than English ("karen" son in law assisting with  interpratation throughout) ?  ?Cognition Arousal/Alertness: Awake/alert ?Behavior During Therapy: Flat affect ?Overall Cognitive Status: Difficult to assess ?  ?  ?  ?  ?  ?  ?  ?  ?  ?  ?  ?  ?  ?  ?  ?  ?General Comments: pt oriented to Dr. Warren DanesXu's orientation questions. required significant time to process questions and cues. difficulty following multisetp commands, question language barrier ?  ?  ?General Comments  VSS on RA, son in law present and providing interpretation. Pt initally 90s/60s upon sitting, likely due ot RN providing medication. BP 120s/60s after sitting for ~5 minutes ? ?  ?Exercises   ?  ?Shoulder Instructions    ? ? ?Home Living Family/patient expects to be discharged to:: Private residence ?Living Arrangements: Children (daughter, son-in-law, and 3 grandkids`) ?Available Help at Discharge: Family;Available 24 hours/day ?Type of Home: House ?Home Access: Level entry ?  ?  ?Home Layout: Two level;Able to live on main level with bedroom/bathroom ?  ?  ?Bathroom Shower/Tub: Tub/shower unit ?  ?Bathroom Toilet: Standard ?  ?  ?Home Equipment: None ?  ?Additional Comments: son in law provided information ?  ? ?  ?Prior Functioning/Environment Prior Level of Function : Needs assist ?  ?  ?  ?  ?  ?  ?Mobility Comments: no AD ?ADLs Comments: son in law states she "sometimes" needs assist, did not detail further. pt's daughter is home with pt 24/7 ?  ? ?  ?  ?OT Problem List: Decreased strength;Decreased range of motion;Decreased activity tolerance;Impaired balance (sitting and/or standing);Decreased cognition;Decreased coordination;Impaired UE functional use ?  ?   ?  OT Treatment/Interventions: Self-care/ADL training;Therapeutic exercise;DME and/or AE instruction;Therapeutic activities;Patient/family education;Balance training  ?  ?OT Goals(Current goals can be found in the care plan section) Acute Rehab OT Goals ?Patient Stated Goal: did not state ?OT Goal Formulation: With patient ?Time For Goal  Achievement: 03/30/22 ?Potential to Achieve Goals: Good ?ADL Goals ?Pt Will Perform Grooming: with min guard assist;standing ?Pt Will Perform Lower Body Bathing: with min guard assist;sit to/from stand ?Pt Will Perfo

## 2022-03-16 NOTE — Progress Notes (Signed)
Physical Therapy Treatment ?Patient Details ?Name: Paula Combs ?MRN: [id number] ?DOB: 04-07-1961 ?Today's Date: 03/16/2022 ? ? ?History of Present Illness The pt is a 61 yo female presenting 4/29 with slurred speech and R-sided facial droop. Imaging revealed acute intraparenchymal hemorrhage of L basal ganglia.   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 ? ?  ?PT Comments  ? ? The pt was able to demo good progress with OOB mobility and was able to complete short bout of ambulation in the room with modA of 2 to steady and facilitate wt shift. The pt was able to demo good ability to follow cues and answer orientation questions at this time, but requires increased processing time and often repeated instructions to complete tasks. All VSS with mobility, but given current mobility deficits and level of assist needed, will benefit from AIR at d/c to maximize functional recovery and reduce caregiver burden after d/c.  ?   ?Recommendations for follow up therapy are one component of a multi-disciplinary discharge planning process, led by the attending physician.  Recommendations may be updated based on patient status, additional functional criteria and insurance authorization. ? ?Follow Up Recommendations ? Acute inpatient rehab (3hours/day) ?  ?  ?Assistance Recommended at Discharge Frequent or constant Supervision/Assistance  ?Patient can return home with the following A lot of help with walking and/or transfers;A lot of help with bathing/dressing/bathroom;Assistance with cooking/housework;Direct supervision/assist for financial management;Direct supervision/assist for medications management;Assistance with feeding;Assist for transportation;Help with stairs or ramp for entrance ?  ?Equipment Recommendations ? Rolling walker (2 wheels);BSC/3in1;Wheelchair (measurements PT);Wheelchair cushion (measurements PT)  ?  ?Recommendations for Other Services Rehab  consult ? ? ?  ?Precautions / Restrictions Precautions ?Precautions: Fall ?Restrictions ?Weight Bearing Restrictions: No  ?  ? ?Mobility ? Bed Mobility ?Overal bed mobility: Needs Assistance ?Bed Mobility: Supine to Sit ?  ?  ?Supine to sit: Min assist ?  ?  ?General bed mobility comments: use of chuck pad to advance hips to EOB ?  ? ?Transfers ?Overall transfer level: Needs assistance ?Equipment used: 2 person hand held assist ?Transfers: Sit to/from Stand ?Sit to Stand: Min assist, +2 physical assistance, +2 safety/equipment ?  ?  ?  ?  ?  ?General transfer comment: ambulated with 2 person hand hold ~8 feet min A +2 for balance and safety ?  ? ?Ambulation/Gait ?Ambulation/Gait assistance: Mod assist, +2 physical assistance ?Gait Distance (Feet): 15 Feet ?Assistive device: 2 person hand held assist ?Gait Pattern/deviations: Step-through pattern, Decreased stride length, Ataxic ?Gait velocity: decreased ?Gait velocity interpretation: <1.31 ft/sec, indicative of household ambulator ?  ?General Gait Details: pt with inconsistent placement of steps and strides, assist to wt shift as well for each step. BUE support ? ? ?Modified Rankin (Stroke Patients Only) ?Modified Rankin (Stroke Patients Only) ?Pre-Morbid Rankin Score: No symptoms ?Modified Rankin: Moderately severe disability ? ? ?  ?Balance Overall balance assessment: Needs assistance ?Sitting-balance support: No upper extremity supported, Feet supported ?Sitting balance-Leahy Scale: Fair ?Sitting balance - Comments: min G for safety ?  ?Standing balance support: Bilateral upper extremity supported, During functional activity ?Standing balance-Leahy Scale: Poor ?Standing balance comment: statically stands with 1 person hand hole, ambulates with 2 person hand hold ?  ?  ?  ?  ?  ?  ?  ?  ?  ?  ?  ?  ? ?  ?Cognition Arousal/Alertness: Awake/alert ?Behavior During Therapy: Flat affect ?Overall  Cognitive Status: Difficult to assess ?  ?  ?  ?  ?  ?  ?  ?  ?  ?  ?  ?   ?  ?  ?  ?  ?General Comments: pt oriented to Dr. Warren Danes orientation questions. required significant time to process questions and cues. difficulty following multisetp commands, question language barrier ?  ?  ? ?  ?Exercises   ? ?  ?General Comments General comments (skin integrity, edema, etc.): VSS on RA, son in law present and providing interpretation. Pt initally 90s/60s upon sitting, likely due ot RN providing medication. BP 120s/60s after sitting for ~5 minutes ?  ?  ? ?Pertinent Vitals/Pain Pain Assessment ?Pain Assessment: Faces ?Faces Pain Scale: Hurts a little bit ?Pain Location: unsure ?Pain Descriptors / Indicators: Grimacing  ? ? ? ?PT Goals (current goals can now be found in the care plan section) Acute Rehab PT Goals ?Patient Stated Goal: none stated ?PT Goal Formulation: With patient ?Time For Goal Achievement: 03/29/22 ?Potential to Achieve Goals: Good ?Progress towards PT goals: Progressing toward goals ? ?  ?Frequency ? ? ? Min 4X/week ? ? ? ?  ?PT Plan Current plan remains appropriate  ? ? ?Co-evaluation PT/OT/SLP Co-Evaluation/Treatment: Yes ?Reason for Co-Treatment: Complexity of the patient's impairments (multi-system involvement);Necessary to address cognition/behavior during functional activity;For patient/therapist safety;To address functional/ADL transfers ?PT goals addressed during session: Mobility/safety with mobility;Balance;Strengthening/ROM ?  ?  ? ?  ?AM-PAC PT "6 Clicks" Mobility   ?Outcome Measure ? Help needed turning from your back to your side while in a flat bed without using bedrails?: A Little ?Help needed moving from lying on your back to sitting on the side of a flat bed without using bedrails?: A Little ?Help needed moving to and from a bed to a chair (including a wheelchair)?: A Lot ?Help needed standing up from a chair using your arms (e.g., wheelchair or bedside chair)?: A Lot ?Help needed to walk in hospital room?: Total ?Help needed climbing 3-5 steps with a railing?  : Total ?6 Click Score: 12 ? ?  ?End of Session Equipment Utilized During Treatment: Gait belt ?Activity Tolerance: Patient tolerated treatment well ?Patient left: in chair;with call bell/phone within reach;with chair alarm set;with family/visitor present ?Nurse Communication: Mobility status ?PT Visit Diagnosis: Other abnormalities of gait and mobility (R26.89);Muscle weakness (generalized) (M62.81) ?  ? ? ?Time: 1001-1031 ?PT Time Calculation (min) (ACUTE ONLY): 30 min ? ?Charges:  $Gait Training: 8-22 mins          ?          ? ?Vickki Muff, PT, DPT  ? ?Acute Rehabilitation Department ?Pager #: [phone] ? ? ?Ronnie Derby ?03/16/2022, 2:59 PM ? ?

## 2022-03-17 ENCOUNTER — Encounter (HOSPITAL_COMMUNITY): Payer: Self-pay | Admitting: Neurology

## 2022-03-17 DIAGNOSIS — R1310 Dysphagia, unspecified: Secondary | ICD-10-CM | POA: Diagnosis not present

## 2022-03-17 DIAGNOSIS — E785 Hyperlipidemia, unspecified: Secondary | ICD-10-CM | POA: Diagnosis not present

## 2022-03-17 DIAGNOSIS — E119 Type 2 diabetes mellitus without complications: Secondary | ICD-10-CM

## 2022-03-17 DIAGNOSIS — I161 Hypertensive emergency: Secondary | ICD-10-CM

## 2022-03-17 DIAGNOSIS — I10 Essential (primary) hypertension: Secondary | ICD-10-CM | POA: Diagnosis not present

## 2022-03-17 DIAGNOSIS — I629 Nontraumatic intracranial hemorrhage, unspecified: Secondary | ICD-10-CM | POA: Diagnosis not present

## 2022-03-17 HISTORY — DX: Type 2 diabetes mellitus without complications: E11.9

## 2022-03-17 HISTORY — DX: Hyperlipidemia, unspecified: E78.5

## 2022-03-17 HISTORY — DX: Essential (primary) hypertension: I10

## 2022-03-17 LAB — CBC
HCT: 36.7 % (ref 36.0–46.0)
Hemoglobin: 12.4 g/dL (ref 12.0–15.0)
MCH: 31.7 pg (ref 26.0–34.0)
MCHC: 33.8 g/dL (ref 30.0–36.0)
MCV: 93.9 fL (ref 80.0–100.0)
Platelets: 362 10*3/uL (ref 150–400)
RBC: 3.91 MIL/uL (ref 3.87–5.11)
RDW: 11.9 % (ref 11.5–15.5)
WBC: 6.7 10*3/uL (ref 4.0–10.5)
nRBC: 0 % (ref 0.0–0.2)

## 2022-03-17 LAB — TRIGLYCERIDES: Triglycerides: 113 mg/dL (ref <150)

## 2022-03-17 LAB — GLUCOSE, CAPILLARY
Glucose-Capillary: 123 mg/dL — ABNORMAL HIGH (ref 70–99)
Glucose-Capillary: 191 mg/dL — ABNORMAL HIGH (ref 70–99)
Glucose-Capillary: 154 mg/dL — ABNORMAL HIGH (ref 70–99)
Glucose-Capillary: 165 mg/dL — ABNORMAL HIGH (ref 70–99)
Glucose-Capillary: 125 mg/dL — ABNORMAL HIGH (ref 70–99)
Glucose-Capillary: 139 mg/dL — ABNORMAL HIGH (ref 70–99)

## 2022-03-17 LAB — BASIC METABOLIC PANEL
Anion gap: 7 (ref 5–15)
BUN: 11 mg/dL (ref 8–23)
CO2: 20 mmol/L — ABNORMAL LOW (ref 22–32)
Calcium: 8.7 mg/dL — ABNORMAL LOW (ref 8.9–10.3)
Chloride: 110 mmol/L (ref 98–111)
Creatinine, Ser: 0.72 mg/dL (ref 0.44–1.00)
GFR, Estimated: >60 mL/min (ref >60)
Glucose, Bld: 123 mg/dL — ABNORMAL HIGH (ref 70–99)
Potassium: 3.7 mmol/L (ref 3.5–5.1)
Sodium: 137 mmol/L (ref 135–145)

## 2022-03-17 NOTE — Progress Notes (Signed)
?PROGRESS NOTE ? ? ? Paula Combs?Paula Combs  ZOX:[id number] DOB: 05/18/1961 DOA: 03/14/2022 ?[facility]  ? ? ?Chief Complaint  ?Patient presents with  ? Facial Droop  ? ? ?Brief Narrative: ?Patient is a 61 year old female history of hypertension, diabetes, hyperlipidemia, glaucoma with loss of vision in the right eye, ACOM aneurysm status post coil who presented to the ED on awakening with aphasia with difficulty speaking and understanding.  Patient noted to have antihypertensive medications adjusted recently and was supposed to be taking 2 pills but was taking 1 of this particular medication.  Patient presented to the ED with imaging concerning for acute parenchymal hemorrhage involving the left basal ganglia with mild edema, no intraventricular extension no significant mass effect.  Called anterior communicating artery region aneurysm. ?-Patient admitted to the ICU with a subcortical ICH and was being managed per neurology.  Stroke work-up underway.  Patient transferred out of ICU and transferred to [facility] service ? ? ?Assessment & Plan: ?  ?Principal Problem: ?  Intracranial hemorrhage (HCC) ?Active Problems: ?  Intracerebral hemorrhage ?  HTN (hypertension) ?  Hyperlipidemia ?  Dysphagia ?  Controlled type 2 diabetes mellitus without complication, without long-term current use of insulin (HCC) ?  Hypertensive emergency ? ? 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  Patient also noted with confusion. ?-CT head done with left basal ganglia intracerebral hemorrhage. ?-Repeat head CT shows stable ICH. ?-CT angio head and neck done with mild to moderate stenosis of the left M1/M2, no AVM or aneurysm. ?-2D echo with EF of 60 to 65%,NWMA, grade 1 diastolic dysfunction. ?-Hemoglobin A1c 5.7. ?-LDL 153. ?-Patient being followed by neurology. ?-No antithrombotic agent due to IPH. ?-PT/OT recommending CIR. ?-Continue PT/OT/SLP. ?-Per neurology. ? ?2.   Hypertensive emergency/hypertension ?-Was on Cleviprex which has been weaned off. ?-Continue Avapro. ?-Goal SBP < 160. ?-On labetalol as needed. ? ?3.  Hyperlipidemia ?-Noted to be on statin prior to admission and not resumed in hospital secondary to IPH per neurology. ?-LDL 153 with goal LDL , 70. ?-Per neurology increase statin and resume on discharge. ? ?4.  Diabetes mellitus type 2 ?-Hemoglobin A1c 5.7 (03/14/2022). ?-CBG 191 this morning. ?-Hold oral hypoglycemic agents ?-SSI. ? ?5.  Dysphagia ?-Likely secondary to problem #1. ?-Patient being followed by SLP and currently on a dysphagia 3 diet with thin liquids. ? ?6.  Right eye blindness ?-Outpatient follow-up. ? ?7.  ACOM aneurysm status post coiling ?-Per neurology. ? ? ?DVT prophylaxis: SCDs ?Code Status: Full ?Family Communication: No family at bedside. ?Disposition: Likely CIR pending insurance authorization ? ?Status is: Inpatient ?Remains inpatient appropriate because: Severity of illness. ?  ?Consultants:  ?Admitted to neurology ? ? ?Procedures:  ?CT head 03/15/2022 ?CT angiogram head and neck 03/14/2022 ?Modified barium swallow pending 03/16/2022 ?Right upper quadrant abdominal ultrasound 03/14/2022 ?2D echo 03/15/2022 ? ? ? ?Antimicrobials:  ?None ? ? ?Subjective: ?Aphasic.  Following simple commands.  Nods head and shakes head to answer questions.   ? ?Objective: ?Vitals:  ? 03/17/22 0335 03/17/22 0805 03/17/22 1113 03/17/22 1559  ?BP: (!) 155/93 (!) 157/95 127/82 138/83  ?Pulse: 78 92 65 74  ?Resp: 16 18 18 18   ?Temp: 98.1 ?F (36.7 ?C) 98.2 ?F (36.8 ?C) 98 ?F (36.7 ?C) 98.3 ?F (36.8 ?C)  ?TempSrc: Oral   Oral  ?SpO2: 97% 98% 99% 98%  ?Weight:      ?Height:      ? ? ?Intake/Output Summary (Last  24 hours) at 03/17/2022 1822 ?Last data filed at 03/17/2022 1133 ?Gross per 24 hour  ?Intake 818.27 ml  ?Output --  ?Net 818.27 ml  ? ?Filed Weights  ? 03/14/22 1611 03/14/22 1807  ?Weight: 64.4 kg 58.1 kg  ? ? ?Examination: ? ?General exam: Appears calm and  comfortable.  Aphasic. ?Respiratory system: Clear to auscultation anterior lung fields.  No wheezes, no crackles, no rhonchi.Marland Kitchen Respiratory effort normal. ?Cardiovascular system: S1 & S2 heard, RRR. No JVD, murmurs, rubs, gallops or clicks. No pedal edema. ?Gastrointestinal system: Abdomen is nondistended, soft and nontender. No organomegaly or masses felt. Normal bowel sounds heard. ?Central nervous system: Alert.  Aphasic.Marland Kitchen  Slight dysarthria.  No focal neurological deficits.  4/5 bilateral upper extremity strength.  3/5 bilateral lower extremity strength. ?Extremities: 4/5 bilateral upper extremity strength.  3/5 bilateral lower extremity strength.   ?Skin: No rashes, lesions or ulcers ?Psychiatry: Judgement and insight appear normal. Mood & affect appropriate.  ? ? ? ?Data Reviewed: I have personally reviewed following labs and imaging studies ? ?CBC: ?Recent Labs  ?Lab 03/14/22 ?1445 03/15/22 ?0140 03/17/22 ?2774  ?WBC 8.1 7.3 6.7  ?NEUTROABS 5.5 4.6  --   ?HGB 12.6 13.2 12.4  ?HCT 38.0 38.4 36.7  ?MCV 94.8 93.9 93.9  ?PLT 396 397 362  ? ? ?Basic Metabolic Panel: ?Recent Labs  ?Lab 03/14/22 ?1445 03/15/22 ?0140 03/17/22 ?1287  ?NA 139 138 137  ?K 3.6 3.3* 3.7  ?CL 105 107 110  ?CO2 25 20* 20*  ?GLUCOSE 222* 146* 123*  ?BUN 11 8 11   ?CREATININE 0.84 0.64 0.72  ?CALCIUM 9.3 9.1 8.7*  ? ? ?GFR: ?Estimated Creatinine Clearance: 55.9 mL/min (by C-G formula based on SCr of 0.72 mg/dL). ? ?Liver Function Tests: ?Recent Labs  ?Lab 03/14/22 ?1445 03/15/22 ?0140  ?AST 57* 41  ?ALT 51* 47*  ?ALKPHOS 102 97  ?BILITOT 0.5 0.5  ?PROT 8.6* 8.2*  ?ALBUMIN 3.8 3.8  ? ? ?CBG: ?Recent Labs  ?Lab 03/16/22 ?2332 03/17/22 ?0403 03/17/22 ?05/17/22 03/17/22 ?1112 03/17/22 ?1617  ?GLUCAP 137* 123* 191* 154* 165*  ? ? ? ?No results found for this or any previous visit (from the past 240 hour(s)).  ? ? ? ? ? ?Radiology Studies: ?DG Swallowing Func-Speech Pathology ? ?Result Date: 03/16/2022 ?Modified Barium Swallow Progress Note   Patient  Details Name: Paula Combs MRN: Paula Combs Date of Birth: Jan 04, 1961   Today's Date: 03/16/2022   Modified Barium Swallow completed.  Full report located under Chart Review in the Imaging Section.   Brief recommendations include the following:   Clinical Impression             Pt demonstrates a mild oral dysphagia which improved throughout study as more and more POs were given and pt appeared to better initaition lingual coordination. Pt initially unable to lateralize solids for mastication, but then achieved adequate mastication of nutrigrain bar with extra time. Initiation of swallowing with almost all boluses occurred at the level of the pyriform sinuses though only trace flash penetration occurred with thin liquids PAS 2. Pt may initiate a mechanical soft diet with thin liquids as long as alert and upright. SLP to f/u for tolerance.  Swallow Evaluation Recommendations        SLP Diet Recommendations: Dysphagia 3 (Mech soft) solids;Thin liquid    Liquid Administration via: Cup;Straw    Medication Administration: Whole meds with puree    Supervision: Staff to assist with self feeding    Compensations: Slow rate;Small  sips/bites    Postural Changes: Seated upright at 90 degrees               DeBlois, Riley Nearing 03/16/2022,2:20 PM Electronically signed by Clearance Coots, CCC-SLP at 03/16/2022  2:20 PM   ? ? ? ? ? ?Scheduled Meds: ? Chlorhexidine Gluconate Cloth  6 each Topical Daily  ? insulin aspart  0-15 Units Subcutaneous Q4H  ? irbesartan  75 mg Oral Daily  ? pantoprazole  40 mg Oral Daily  ? senna-docusate  1 tablet Oral BID  ? timolol  1 drop Both Eyes BID  ? ?Continuous Infusions: ? sodium chloride 1,000 mL (03/17/22 1728)  ? ? ? LOS: 3 days  ? ? ?Time spent: 35 minutes ? ? ? ?Ramiro Harvest, MD ?[facility] ? ? ?To contact the attending provider between 7A-7P or the covering provider during after hours 7P-7A, please log into the web site [email or web address] and access using universal [place] password for  that web site. If you do not have the password, please call the hospital operator. ? ?03/17/2022, 6:22 PM  ?  ?

## 2022-03-17 NOTE — Progress Notes (Signed)
Physical Therapy Treatment ?Patient Details ?Name: Paula Combs ?MRN: [id number] ?DOB: February 18, 1961 ?Today's Date: 03/17/2022 ? ? ?History of Present Illness The pt is a 61 yo female presenting 4/29 with slurred speech and R-sided facial droop. Imaging revealed acute intraparenchymal hemorrhage of L basal ganglia.   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 ? ?  ?PT Comments  ? ? No family present to interpret, ipad translator doesn't have Santiago Glad as a language. Pt able to follow demonstration majority of the time. Pt with improved ambulation tolerance but continues to requiring bilat HHA for safe ambulation. Will trial RW next session. Pt continues with noted R sided weakness but appears to have improved since initial eval. Acute PT to cont to follow. Pt to continue to benefit from AIR upon d/c to progress towards indep function. ?   ?Recommendations for follow up therapy are one component of a multi-disciplinary discharge planning process, led by the attending physician.  Recommendations may be updated based on patient status, additional functional criteria and insurance authorization. ? ?Follow Up Recommendations ? Acute inpatient rehab (3hours/day) ?  ?  ?Assistance Recommended at Discharge Frequent or constant Supervision/Assistance  ?Patient can return home with the following A lot of help with walking and/or transfers;A lot of help with bathing/dressing/bathroom;Assistance with cooking/housework;Direct supervision/assist for financial management;Direct supervision/assist for medications management;Assistance with feeding;Assist for transportation;Help with stairs or ramp for entrance ?  ?Equipment Recommendations ? Rolling walker (2 wheels)  ?  ?Recommendations for Other Services Rehab consult ? ? ?  ?Precautions / Restrictions Precautions ?Precautions: Fall ?Restrictions ?Weight Bearing Restrictions: No  ?  ? ?Mobility ? Bed Mobility ?Overal bed  mobility: Needs Assistance ?Bed Mobility: Supine to Sit ?  ?  ?Supine to sit: Min assist ?  ?  ?General bed mobility comments: with demonstration pt initiated transfer, minA for LE management off EOB, suspect more of a language barrier vs inability to physical perform ?  ? ?Transfers ?Overall transfer level: Needs assistance ?Equipment used: 2 person hand held assist ?Transfers: Sit to/from Stand ?Sit to Stand: Min assist, +2 physical assistance, +2 safety/equipment ?  ?  ?  ?  ?  ?General transfer comment: max tactile cues with demonstration, pt with noted R sided weakness ?  ? ?Ambulation/Gait ?Ambulation/Gait assistance: Mod assist, +2 physical assistance ?Gait Distance (Feet): 60 Feet ?Assistive device: 2 person hand held assist ?Gait Pattern/deviations: Step-through pattern, Decreased stride length ?Gait velocity: dec ?Gait velocity interpretation: <1.31 ft/sec, indicative of household ambulator ?  ?General Gait Details: pt requiring tactile cues to initiate movement, pt with noted R sided weaknes with R LE instability/antalgia, pt with improved stability with rehab tech infront holding patients hand in the front, and PT stabilizing hips posteriorly ? ? ?Stairs ?  ?  ?  ?  ?  ? ? ?Wheelchair Mobility ?  ? ?Modified Rankin (Stroke Patients Only) ?Modified Rankin (Stroke Patients Only) ?Pre-Morbid Rankin Score: No symptoms ?Modified Rankin: Moderately severe disability ? ? ?  ?Balance Overall balance assessment: Needs assistance ?Sitting-balance support: No upper extremity supported, Feet supported ?Sitting balance-Leahy Scale: Fair ?  ?  ?Standing balance support: Bilateral upper extremity supported, During functional activity ?Standing balance-Leahy Scale: Poor ?Standing balance comment: requires assist to prevent fall ?  ?  ?  ?  ?  ?  ?  ?  ?  ?  ?  ?  ? ?  ?Cognition Arousal/Alertness: Awake/alert ?  Behavior During Therapy: Flat affect ?Overall Cognitive Status: Difficult to assess ?  ?  ?  ?  ?  ?  ?  ?  ?   ?  ?  ?  ?  ?  ?  ?  ?General Comments: pt non-english speaking, non-verbal but did follow gestural commands properly ?  ?  ? ?  ?Exercises General Exercises - Lower Extremity ?Long Arc Quad: AROM, Both, 10 reps, Seated (R LE with noted weakness compared to L) ?Hip Flexion/Marching: AROM, Both, 10 reps, Seated (R LE with noted weakness) ?Other Exercises ?Other Exercises: pt given small ball to squeeze with R hand ?Other Exercises: pt raised R UE  above head x 10 reps ? ?  ?General Comments General comments (skin integrity, edema, etc.): VSS ?  ?  ? ?Pertinent Vitals/Pain Pain Assessment ?Pain Assessment: Faces ?Faces Pain Scale: No hurt  ? ? ?Home Living   ?  ?  ?  ?  ?  ?  ?  ?  ?  ?   ?  ?Prior Function    ?  ?  ?   ? ?PT Goals (current goals can now be found in the care plan section) Progress towards PT goals: Progressing toward goals ? ?  ?Frequency ? ? ? Min 4X/week ? ? ? ?  ?PT Plan Current plan remains appropriate  ? ? ?Co-evaluation   ?  ?  ?  ?  ? ?  ?AM-PAC PT "6 Clicks" Mobility   ?Outcome Measure ? Help needed turning from your back to your side while in a flat bed without using bedrails?: A Little ?Help needed moving from lying on your back to sitting on the side of a flat bed without using bedrails?: A Little ?Help needed moving to and from a bed to a chair (including a wheelchair)?: A Lot ?Help needed standing up from a chair using your arms (e.g., wheelchair or bedside chair)?: A Lot ?Help needed to walk in hospital room?: A Lot ?Help needed climbing 3-5 steps with a railing? : Total ?6 Click Score: 13 ? ?  ?End of Session Equipment Utilized During Treatment: Gait belt ?Activity Tolerance: Patient tolerated treatment well ?Patient left: in chair;with call bell/phone within reach;with chair alarm set ?Nurse Communication: Mobility status ?PT Visit Diagnosis: Other abnormalities of gait and mobility (R26.89);Muscle weakness (generalized) (M62.81) ?  ? ? ?Time: [id number] ?PT Time Calculation (min)  (ACUTE ONLY): 23 min ? ?Charges:  $Gait Training: 8-22 mins ?$Therapeutic Exercise: 8-22 mins          ?          ? ?Kittie Plater, PT, DPT ?Acute Rehabilitation Services ?Secure chat preferred ?Office #: [phone] ? ? ? ?Caro Brundidge M Shaylynne Lunt ?03/17/2022, 2:19 PM ? ?

## 2022-03-17 NOTE — Plan of Care (Signed)
  Problem: Education: Goal: Knowledge of General Education information will improve Description Including pain rating scale, medication(s)/side effects and non-pharmacologic comfort measures Outcome: Progressing   Problem: Health Behavior/Discharge Planning: Goal: Ability to manage health-related needs will improve Outcome: Progressing   

## 2022-03-17 NOTE — Progress Notes (Addendum)
STROKE TEAM PROGRESS NOTE  ? ?INTERVAL HISTORY ?The patient is seen in her room this morning, no family at the bedside. Patient is alert and partially oriented. Able to follow commands today.  Her voice is hypophonic and she does have some dysarthria.  Diet initiated by SLP yesterday and patient moved out of ICU.  Family meeting today with rehab to discuss CIR.  ? ?Vitals:  ? 03/16/22 2312 03/17/22 0335 03/17/22 0805 03/17/22 1113  ?BP: 139/85 (!) 155/93 (!) 157/95 127/82  ?Pulse: 73 78 92 65  ?Resp: 16 16 18 18   ?Temp: (!) 97.5 ?F (36.4 ?C) 98.1 ?F (36.7 ?C) 98.2 ?F (36.8 ?C) 98 ?F (36.7 ?C)  ?TempSrc: Oral Oral    ?SpO2: 98% 97% 98% 99%  ?Weight:      ?Height:      ? ?CBC:  ?Recent Labs  ?Lab 03/14/22 ?1445 03/15/22 ?0140 03/17/22 ?05/17/22  ?WBC 8.1 7.3 6.7  ?NEUTROABS 5.5 4.6  --   ?HGB 12.6 13.2 12.4  ?HCT 38.0 38.4 36.7  ?MCV 94.8 93.9 93.9  ?PLT 396 397 362  ? ? ?Basic Metabolic Panel:  ?Recent Labs  ?Lab 03/15/22 ?0140 03/17/22 ?05/17/22  ?NA 138 137  ?K 3.3* 3.7  ?CL 107 110  ?CO2 20* 20*  ?GLUCOSE 146* 123*  ?BUN 8 11  ?CREATININE 0.64 0.72  ?CALCIUM 9.1 8.7*  ? ? ?Lipid Panel:  ?Recent Labs  ?Lab 03/15/22 ?0140 03/17/22 ?05/17/22  ?CHOL 246*  --   ?TRIG 275* 113  ?HDL 38*  --   ?CHOLHDL 6.5  --   ?VLDL 55*  --   ?LDLCALC 153*  --   ? ? ?HgbA1c:  ?Recent Labs  ?Lab 03/14/22 ?2038  ?HGBA1C 5.7*  ? ? ?Urine Drug Screen: No results for input(s): LABOPIA, COCAINSCRNUR, LABBENZ, AMPHETMU, THCU, LABBARB in the last 168 hours.  ?Alcohol Level No results for input(s): ETH in the last 168 hours. ? ?IMAGING past 24 hours ?No results found. ? ?PHYSICAL EXAM ? ?Physical Exam  ?Constitutional: Appears well-developed and well-nourished.  ?Cardiovascular: Normal rate and regular rhythm.  ?Respiratory: Effort normal, non-labored breathing ? ?Neuro: ?Mental Status: ?Patient is drowsy, awakens to voice, oriented to person and place, states age as "31." ?Significant delay in following commands ?Cranial Nerves: ?II: Visual Fields are  full, blinks to threat bilaterally.  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 ?XII: Tongue protrudes midline without atrophy or fasciculations.  ?Motor: ?Tone is normal. Bulk is normal. Bilateral UE are 4/5 with good grip.  + RLE babinski ?Plantar and dorsi flexion slightly weaker on RLE at a 3-/5. DF/PF on LLE 3/5  ?BLE 3/5 ?Sensory: ?Sensation is symmetric to light touch in the arms and legs. ?Cerebellar: ?FNF with mild L hand ataxia ? ? ?ASSESSMENT/PLAN ?Paula Combs is a 61 y.o. 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 CTH notable for IPH of the L basal ganglia without midline shift. ICH score of 0. SBP on arrival of 175-182.  Unable to obtain MRI due to aneurysm coil.  Cleviprex is off, on as needed BP meds IVP ? 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 ?Therapy recommendations: CIR ?Disposition:  pending ? ?Hypertension ?Home meds:  irbesartan ?Stable on Cleviprex ?BP goal <160 ?Resume home BP meds ?Long-term BP goal normotensive ?Labetolol 10mg  q2hr PRN ? ?Hyperlipidemia ?Home meds:  atorvastatin 40 mg, not resumed in hospital given IPH ?LDL 153, goal < 70 ?Increase statin at discharge ? ?Diabetes ?Home meds: metformin ?HgbA1c 5.7, goal < 7.0 ?CBGs ?SSI ? ?Dysphagia ?Now on dysphagia 3 and thin liquid ?On gentle IV fluid ? 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 ?Other acute issues ?Right eye blind ?ACOM aneurysm status post coiling 8 to 9 years ago ? ?Code Status: Full Code   ? ?Hospital day # 3 ? ? ?Patient seen and examined by NP/APP with MD. MD to update note as needed.  ? ?11-02-1992, DNP, FNP-BC ?[facility] ?Pager: [phone] ? ?ATTENDING NOTE: ?I reviewed above note and agree with the assessment and plan. Pt was seen and examined.  ? ?Pt was seen sitting in bed for lunch, she was able to feed herself without assistance. She still has mild right facial droop, no obvious motor deficit. No family at bedside and no interpretor present. BP under goal. On BP meds. Close monitoring. Pending CIR placement.  ? ?For detailed assessment and plan, please refer to above as I have made changes wherever appropriate.  ? ?Neurology will sign off. Please call with questions. Pt will follow up with stroke clinic NP at [facility] in about 4 weeks. Thanks for the consult. ? ? ?[facility], MD PhD ?Stroke Neurology ?03/17/2022 ?9:39 PM ? ? ? ?To contact Stroke Continuity provider, please refer to 05/17/2022. ?After hours, contact General Neurology ? ?

## 2022-03-17 NOTE — Plan of Care (Signed)
?  Problem: Education: ?Goal: Knowledge of General Education information will improve ?Description: Including pain rating scale, medication(s)/side effects and non-pharmacologic comfort measures ?Outcome: Progressing ?  ?Problem: Clinical Measurements: ?Goal: Will remain free from infection ?Outcome: Progressing ?Goal: Diagnostic test results will improve ?Outcome: Progressing ?Goal: Cardiovascular complication will be avoided ?Outcome: Progressing ?  ?Problem: Nutrition: ?Goal: Adequate nutrition will be maintained ?Outcome: Progressing ?  ?Problem: Safety: ?Goal: Ability to remain free from injury will improve ?Outcome: Progressing ?  ?

## 2022-03-17 NOTE — Progress Notes (Addendum)
?  Inpatient Rehabilitation Admissions Coordinator  ? ?I left a voicemail for NOK listed , Velora Mediate, to contact me upon arrival so that I can discuss with family rehab venue options. RN, Tamera Punt, made aware. ? ?Ottie Glazier, RN, MSN ?Rehab Admissions Coordinator ?[phone] ?03/17/2022 11:54 AM ? ?NOK called back and we are meeting today at 430 today . ? ?Ottie Glazier, RN, MSN ?Rehab Admissions Coordinator ?[phone] ?03/17/2022 11:58 AM ? ? ?

## 2022-03-17 NOTE — Progress Notes (Signed)
Inpatient Rehabilitation Admissions Coordinator  ? ?I met with patient's son in law. He states she lives with he and his wife, Htoo and family. I discussed goals and expectations of a a possible Cir admit and he is in agreement. I have begun insurance approval earlier today and await payor approval to admit. I will follow up tomorrow. ? ?Danne Baxter, RN, MSN ?Rehab Admissions Coordinator ?[phone] ?03/17/2022 4:57 PM ? ?

## 2022-03-18 ENCOUNTER — Inpatient Hospital Stay (HOSPITAL_COMMUNITY)
Admission: RE | Admit: 2022-03-18 | Discharge: 2022-03-28 | DRG: 057 | Disposition: A | Discharge status: Home / Self Care | Payer: Medicaid Other | Source: Intra-hospital | Attending: Physical Medicine & Rehabilitation | Admitting: Physical Medicine & Rehabilitation

## 2022-03-18 ENCOUNTER — Encounter (HOSPITAL_COMMUNITY): Payer: Self-pay | Admitting: Physical Medicine & Rehabilitation

## 2022-03-18 ENCOUNTER — Other Ambulatory Visit: Payer: Self-pay

## 2022-03-18 DIAGNOSIS — R1310 Dysphagia, unspecified: Secondary | ICD-10-CM | POA: Diagnosis present

## 2022-03-18 DIAGNOSIS — I69220 Aphasia following other nontraumatic intracranial hemorrhage: Secondary | ICD-10-CM | POA: Diagnosis not present

## 2022-03-18 DIAGNOSIS — I610 Nontraumatic intracerebral hemorrhage in hemisphere, subcortical: Secondary | ICD-10-CM

## 2022-03-18 DIAGNOSIS — I10 Essential (primary) hypertension: Secondary | ICD-10-CM | POA: Diagnosis present

## 2022-03-18 DIAGNOSIS — E119 Type 2 diabetes mellitus without complications: Secondary | ICD-10-CM | POA: Diagnosis present

## 2022-03-18 DIAGNOSIS — I619 Nontraumatic intracerebral hemorrhage, unspecified: Secondary | ICD-10-CM | POA: Diagnosis not present

## 2022-03-18 DIAGNOSIS — I69291 Dysphagia following other nontraumatic intracranial hemorrhage: Secondary | ICD-10-CM

## 2022-03-18 DIAGNOSIS — E785 Hyperlipidemia, unspecified: Secondary | ICD-10-CM | POA: Diagnosis present

## 2022-03-18 DIAGNOSIS — Z79899 Other long term (current) drug therapy: Secondary | ICD-10-CM

## 2022-03-18 DIAGNOSIS — E663 Overweight: Secondary | ICD-10-CM | POA: Diagnosis present

## 2022-03-18 DIAGNOSIS — R2689 Other abnormalities of gait and mobility: Secondary | ICD-10-CM | POA: Diagnosis present

## 2022-03-18 DIAGNOSIS — Z7984 Long term (current) use of oral hypoglycemic drugs: Secondary | ICD-10-CM

## 2022-03-18 DIAGNOSIS — E669 Obesity, unspecified: Secondary | ICD-10-CM

## 2022-03-18 DIAGNOSIS — H5461 Unqualified visual loss, right eye, normal vision left eye: Secondary | ICD-10-CM | POA: Diagnosis present

## 2022-03-18 DIAGNOSIS — H547 Unspecified visual loss: Secondary | ICD-10-CM

## 2022-03-18 DIAGNOSIS — I679 Cerebrovascular disease, unspecified: Secondary | ICD-10-CM

## 2022-03-18 DIAGNOSIS — Z8673 Personal history of transient ischemic attack (TIA), and cerebral infarction without residual deficits: Secondary | ICD-10-CM | POA: Diagnosis not present

## 2022-03-18 DIAGNOSIS — H4010X0 Unspecified open-angle glaucoma, stage unspecified: Secondary | ICD-10-CM | POA: Diagnosis not present

## 2022-03-18 DIAGNOSIS — R1312 Dysphagia, oropharyngeal phase: Secondary | ICD-10-CM | POA: Diagnosis not present

## 2022-03-18 DIAGNOSIS — Z6827 Body mass index (BMI) 27.0-27.9, adult: Secondary | ICD-10-CM | POA: Diagnosis not present

## 2022-03-18 DIAGNOSIS — E1169 Type 2 diabetes mellitus with other specified complication: Secondary | ICD-10-CM | POA: Diagnosis not present

## 2022-03-18 DIAGNOSIS — H543 Unqualified visual loss, both eyes: Secondary | ICD-10-CM

## 2022-03-18 DIAGNOSIS — H4089 Other specified glaucoma: Secondary | ICD-10-CM | POA: Diagnosis present

## 2022-03-18 DIAGNOSIS — H401110 Primary open-angle glaucoma, right eye, stage unspecified: Secondary | ICD-10-CM | POA: Diagnosis present

## 2022-03-18 DIAGNOSIS — I69298 Other sequelae of other nontraumatic intracranial hemorrhage: Principal | ICD-10-CM

## 2022-03-18 LAB — BASIC METABOLIC PANEL
Anion gap: 8 (ref 5–15)
BUN: 18 mg/dL (ref 8–23)
CO2: 21 mmol/L — ABNORMAL LOW (ref 22–32)
Calcium: 9 mg/dL (ref 8.9–10.3)
Chloride: 111 mmol/L (ref 98–111)
Creatinine, Ser: 0.84 mg/dL (ref 0.44–1.00)
GFR, Estimated: >60 mL/min (ref >60)
Glucose, Bld: 103 mg/dL — ABNORMAL HIGH (ref 70–99)
Potassium: 3.5 mmol/L (ref 3.5–5.1)
Sodium: 140 mmol/L (ref 135–145)

## 2022-03-18 LAB — CBC WITH DIFFERENTIAL/PLATELET
Abs Immature Granulocytes: 0.06 10*3/uL (ref 0.00–0.07)
Basophils Absolute: 0.1 10*3/uL (ref 0.0–0.1)
Basophils Relative: 1 %
Eosinophils Absolute: 0.1 10*3/uL (ref 0.0–0.5)
Eosinophils Relative: 2 %
HCT: 34.3 % — ABNORMAL LOW (ref 36.0–46.0)
Hemoglobin: 11.4 g/dL — ABNORMAL LOW (ref 12.0–15.0)
Immature Granulocytes: 1 %
Lymphocytes Relative: 41 %
Lymphs Abs: 3.1 10*3/uL (ref 0.7–4.0)
MCH: 31.5 pg (ref 26.0–34.0)
MCHC: 33.2 g/dL (ref 30.0–36.0)
MCV: 94.8 fL (ref 80.0–100.0)
Monocytes Absolute: 0.4 10*3/uL (ref 0.1–1.0)
Monocytes Relative: 6 %
Neutro Abs: 3.8 10*3/uL (ref 1.7–7.7)
Neutrophils Relative %: 49 %
Platelets: 358 10*3/uL (ref 150–400)
RBC: 3.62 MIL/uL — ABNORMAL LOW (ref 3.87–5.11)
RDW: 11.9 % (ref 11.5–15.5)
WBC: 7.5 10*3/uL (ref 4.0–10.5)
nRBC: 0 % (ref 0.0–0.2)

## 2022-03-18 LAB — GLUCOSE, CAPILLARY
Glucose-Capillary: 110 mg/dL — ABNORMAL HIGH (ref 70–99)
Glucose-Capillary: 156 mg/dL — ABNORMAL HIGH (ref 70–99)
Glucose-Capillary: 258 mg/dL — ABNORMAL HIGH (ref 70–99)
Glucose-Capillary: 122 mg/dL — ABNORMAL HIGH (ref 70–99)
Glucose-Capillary: 148 mg/dL — ABNORMAL HIGH (ref 70–99)

## 2022-03-18 LAB — MAGNESIUM: Magnesium: 1.9 mg/dL (ref 1.7–2.4)

## 2022-03-18 MED ORDER — IRBESARTAN 75 MG PO TABS
75.0000 mg | ORAL_TABLET | Freq: Every day | ORAL | Status: DC
  Administered 2022-03-19 – 2022-03-28 (×10): 75 mg via ORAL
  Filled 2022-03-18 (×10): qty 1

## 2022-03-18 MED ORDER — INSULIN ASPART 100 UNIT/ML IJ SOLN: 0.0000 [IU] | INTRAMUSCULAR | Status: DC

## 2022-03-18 MED ORDER — INSULIN ASPART 100 UNIT/ML IJ SOLN
0.0000 [IU] | Freq: Three times a day (TID) | INTRAMUSCULAR | Status: DC
  Administered 2022-03-18 – 2022-03-19 (×2): 2 [IU] via SUBCUTANEOUS
  Administered 2022-03-19: 5 [IU] via SUBCUTANEOUS
  Administered 2022-03-19: 2 [IU] via SUBCUTANEOUS
  Administered 2022-03-20 (×3): 3 [IU] via SUBCUTANEOUS
  Administered 2022-03-20 – 2022-03-21 (×2): 2 [IU] via SUBCUTANEOUS
  Administered 2022-03-21: 5 [IU] via SUBCUTANEOUS
  Administered 2022-03-21 – 2022-03-22 (×2): 3 [IU] via SUBCUTANEOUS
  Administered 2022-03-22 (×2): 2 [IU] via SUBCUTANEOUS
  Administered 2022-03-23: 3 [IU] via SUBCUTANEOUS
  Administered 2022-03-23: 2 [IU] via SUBCUTANEOUS
  Administered 2022-03-24 (×2): 3 [IU] via SUBCUTANEOUS
  Administered 2022-03-24 – 2022-03-27 (×7): 2 [IU] via SUBCUTANEOUS

## 2022-03-18 MED ORDER — ALUM & MAG HYDROXIDE-SIMETH 200-200-20 MG/5ML PO SUSP: 30.0000 mL | ORAL | Status: DC | PRN

## 2022-03-18 MED ORDER — PANTOPRAZOLE SODIUM 40 MG PO TBEC
40.0000 mg | DELAYED_RELEASE_TABLET | Freq: Every day | ORAL | Status: DC
  Administered 2022-03-19 – 2022-03-28 (×10): 40 mg via ORAL
  Filled 2022-03-18 (×10): qty 1

## 2022-03-18 MED ORDER — PROCHLORPERAZINE EDISYLATE 10 MG/2ML IJ SOLN: 5.0000 mg | Freq: Four times a day (QID) | INTRAMUSCULAR | Status: DC | PRN

## 2022-03-18 MED ORDER — TRAZODONE HCL 50 MG PO TABS: 25.0000 mg | ORAL_TABLET | Freq: Every evening | ORAL | Status: DC | PRN

## 2022-03-18 MED ORDER — ACETAMINOPHEN 325 MG PO TABS: 325.0000 mg | ORAL_TABLET | ORAL | Status: DC | PRN

## 2022-03-18 MED ORDER — FLEET ENEMA 7-19 GM/118ML RE ENEM: 1.0000 | ENEMA | Freq: Once | RECTAL | Status: DC | PRN

## 2022-03-18 MED ORDER — PROCHLORPERAZINE MALEATE 5 MG PO TABS: 5.0000 mg | ORAL_TABLET | Freq: Four times a day (QID) | ORAL | Status: DC | PRN

## 2022-03-18 MED ORDER — GUAIFENESIN-DM 100-10 MG/5ML PO SYRP: 5.0000 mL | ORAL_SOLUTION | Freq: Four times a day (QID) | ORAL | Status: DC | PRN

## 2022-03-18 MED ORDER — SORBITOL 70 % SOLN: 30.0000 mL | Freq: Every day | Status: DC | PRN

## 2022-03-18 MED ORDER — SENNOSIDES-DOCUSATE SODIUM 8.6-50 MG PO TABS: 1.0000 | ORAL_TABLET | Freq: Every evening | ORAL | Status: DC | PRN

## 2022-03-18 MED ORDER — PROCHLORPERAZINE 25 MG RE SUPP: 12.5000 mg | Freq: Four times a day (QID) | RECTAL | Status: DC | PRN

## 2022-03-18 MED ORDER — METFORMIN HCL 500 MG PO TABS
500.0000 mg | ORAL_TABLET | Freq: Every day | ORAL | Status: DC
  Administered 2022-03-19 – 2022-03-22 (×4): 500 mg via ORAL
  Filled 2022-03-18 (×4): qty 1

## 2022-03-18 MED ORDER — METHOCARBAMOL 500 MG PO TABS: 500.0000 mg | ORAL_TABLET | Freq: Four times a day (QID) | ORAL | Status: DC | PRN

## 2022-03-18 MED ORDER — DIPHENHYDRAMINE HCL 12.5 MG/5ML PO ELIX: 12.5000 mg | ORAL_SOLUTION | Freq: Four times a day (QID) | ORAL | Status: DC | PRN

## 2022-03-18 MED ORDER — ATORVASTATIN CALCIUM 80 MG PO TABS: 80.0000 mg | ORAL_TABLET | Freq: Every day | ORAL | Status: DC

## 2022-03-18 MED ORDER — TIMOLOL MALEATE 0.5 % OP SOLN
1.0000 [drp] | Freq: Two times a day (BID) | OPHTHALMIC | Status: DC
  Administered 2022-03-18 – 2022-03-28 (×20): 1 [drp] via OPHTHALMIC
  Filled 2022-03-18: qty 5

## 2022-03-18 NOTE — Hospital Course (Signed)
Mrs. Paula Combs is a 61 y.o. F with Dm, blindness, ACOM aneurysm s/p coiling who presented with focal weakness. ? ?In the ER, CTH showed intraparenchymal hemorrhage without midline shift.  Started on Cleviprex and admitted to ICU.  See prior summary. ?

## 2022-03-18 NOTE — Progress Notes (Signed)
Pamala Duffel, RN given report.  Pt. Transferred to 5C19 with VSS.  A bag of belongings accompanied the pt.  ?

## 2022-03-18 NOTE — Progress Notes (Signed)
Inpatient Rehabilitation Admissions Coordinator  ? ?I await insurance approval to admit to CIR. ? ?Ottie Glazier, RN, MSN ?Rehab Admissions Coordinator ?[phone] ?03/18/2022 11:50 AM ? ?

## 2022-03-18 NOTE — H&P (Signed)
? ? ?Physical Medicine and Rehabilitation Admission H&P ? ?  ?CC: Functional deficits secondary to subcortical, non-traumatic ICH ? ?HPI: Paula Combs is a 61 year old female who awoke with confusion, slurred speech and right facial droop on 03/14/2022. She presented to [facility] [place] by way of family transportation. Patient speaks a dialect of Clydie BraunKaren and her daughter and son-in-law provide assistance with interpretation. She was outside the window for tenecteplase.  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 Neurology, Dr. Selina CooleyStack, consulted. Admitted to ICU on clevidipine, followed by Dr. Roda ShuttersXu. Unable to obtain MRI due to presence of aneurysm coil. Right leg weaker than left on neurology initial assessment as well as non-verbal. Home meds restarted 5/1. MBS performed now on dysphagia 3 thin liquid diet. Transferred out of ICU on 5/2 to hospitalist service. SSI continues. ? ?Family relates previous stroke in either 2004 or 2005 with difficulty ambulating which improved over time. Right leg weaker than left on neurology initial assessment as well as non-verbal. 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 On ARB for hypertension. Previous anterior communicating artery aneurysm s/p coil and therefore unable to obtain MRI. ? ?Resides in [place]. No tobacco or alcohol use. ? ?EF: 60-65% ?LDL = 153 ?A1C = 5.7 ? ?PCP: ? ?Ophthalmologist: Nilda RiggsMichael Evans, MD ? ?Needs assistance with eating. ? ?Review of Systems  ?Eyes:   ?     Blind right eye  ?Neurological:   ?     Right sided weakness  ?Past Medical History:  ?Diagnosis Date  ? Controlled type 2 diabetes mellitus without complication, without long-term current use of insulin (HCC) 03/17/2022  ? HTN (hypertension) 03/17/2022  ? Hyperlipidemia 03/17/2022  ? ? ?Social History:  has no history on file for tobacco use, alcohol use, and drug use. ?Allergies: No Known Allergies ?Medications Prior to Admission  ?Medication Sig Dispense Refill  ?  alendronate (FOSAMAX) 70 MG tablet Take 70 mg by mouth once a week. Friday    ? atorvastatin (LIPITOR) 40 MG tablet Take 40 mg by mouth daily.    ? ibuprofen (ADVIL) 200 MG tablet Take 400 mg by mouth every 6 (six) hours as needed for fever.    ? irbesartan (AVAPRO) 75 MG tablet Take 75 mg by mouth daily.    ? JANUVIA 100 MG tablet Take 100 mg by mouth daily.    ? metFORMIN (GLUCOPHAGE) 1000 MG tablet Take 1,000 mg by mouth 2 (two) times daily.    ? omeprazole (PRILOSEC) 40 MG capsule Take 40 mg by mouth daily.    ? timolol (TIMOPTIC) 0.5 % ophthalmic solution Place 1 drop into both eyes 2 (two) times daily.    ? ? ? ? ?Home: ?Home Living ?Family/patient expects to be discharged to:: Private residence ?Living Arrangements: Children ?Available Help at Discharge: Family, Available 24 hours/day ?Type of Home: House ?Home Access: Level entry ?Home Layout: Two level, Able to live on main level with bedroom/bathroom ?Bathroom Shower/Tub: Tub/shower unit ?Bathroom Toilet: Standard ?Bathroom Accessibility: Yes ?Home Equipment: None ?Additional Comments: son in law provided information ? Lives With: Daughter (and her family) ?  ?Functional History: ?Prior Function ?Prior Level of Function : Needs assist ?Mobility Comments: no AD ?ADLs Comments: son in law states she "sometimes" needs assist, did not detail further. pt's daughter is home with pt 24/7 ? ?Functional Status:  ?Mobility: ?Bed Mobility ?Overal bed mobility: Needs Assistance ?Bed Mobility: Supine to Sit ?Supine to sit: Min assist ?Sit to supine:  Min assist ?General bed mobility comments: with demonstration pt initiated transfer, minA for LE management off EOB, suspect more of a language barrier vs inability to physical perform ?Transfers ?Overall transfer level: Needs assistance ?Equipment used: 2 person hand held assist ?Transfers: Sit to/from Stand ?Sit to Stand: Min assist, +2 physical assistance, +2 safety/equipment ?General transfer comment: max tactile cues  with demonstration, pt with noted R sided weakness ?Ambulation/Gait ?Ambulation/Gait assistance: Mod assist, +2 physical assistance ?Gait Distance (Feet): 60 Feet ?Assistive device: 2 person hand held assist ?Gait Pattern/deviations: Step-through pattern, Decreased stride length ?General Gait Details: pt requiring tactile cues to initiate movement, pt with noted R sided weaknes with R LE instability/antalgia, pt with improved stability with rehab tech infront holding patients hand in the front, and PT stabilizing hips posteriorly ?Gait velocity: dec ?Gait velocity interpretation: <1.31 ft/sec, indicative of household ambulator ?  ? ?ADL: ?ADL ?Overall ADL's : Needs assistance/impaired ?Eating/Feeding: NPO ?Grooming: Moderate assistance, Sitting ?Upper Body Bathing: Moderate assistance, Sitting ?Lower Body Bathing: Maximal assistance, Sit to/from stand ?Upper Body Dressing : Moderate assistance, Sitting ?Lower Body Dressing: Maximal assistance, Sit to/from stand ?Toilet Transfer: Minimal assistance, +2 for physical assistance, +2 for safety/equipment ?Toilet Transfer Details (indicate cue type and reason): 2 person hand hold ?Toileting- Clothing Manipulation and Hygiene: Minimal assistance, Sitting/lateral lean ?Functional mobility during ADLs: Minimal assistance, +2 for safety/equipment, +2 for physical assistance ?General ADL Comments: limited by language barrier, global weakness, slow processing ? ?Cognition: ?Cognition ?Overall Cognitive Status: Difficult to assess ?Orientation Level: Oriented to person, Oriented to place, Disoriented to time, Disoriented to situation ?Cognition ?Arousal/Alertness: Awake/alert ?Behavior During Therapy: Flat affect ?Overall Cognitive Status: Difficult to assess ?General Comments: pt non-english speaking, non-verbal but did follow gestural commands properly ?Difficult to assess due to: Non-English speaking (no family available, interpreter ipad doens't have the language "[name]"  available.) ? ?Physical Exam: ?Blood pressure (!) 154/104, pulse 73, temperature 98.5 ?F (36.9 ?C), temperature source Oral, resp. rate 16, height 4\' 11"  (1.499 m), weight 58.1 kg, SpO2 96 %. ?Physical Exam ?Gen: no distress, normal appearing ?HEENT: oral mucosa pink and moist, NCAT ?Cardio: Reg rate ?Chest: normal effort, normal rate of breathing ?Abd: soft, non-distended ?Ext: no edema ?Psych: pleasant, normal affect ?Skin: intact ?Neuro/MSK: 3/5 strength b/l LE, 4/5 b/l UE. Sensation intact. Needs assistance to set up her lunch but can spoon soup into mouth with right hand without spillage ? ?Results for orders placed or performed during the hospital encounter of 03/14/22 (from the past 48 hour(s))  ?Glucose, capillary     Status: Abnormal  ? Collection Time: 03/16/22 11:51 AM  ?Result Value Ref Range  ? Glucose-Capillary 129 (H) 70 - 99 mg/dL  ?  Comment: Glucose reference range applies only to samples taken after fasting for at least 8 hours.  ?Glucose, capillary     Status: Abnormal  ? Collection Time: 03/16/22  3:58 PM  ?Result Value Ref Range  ? Glucose-Capillary 135 (H) 70 - 99 mg/dL  ?  Comment: Glucose reference range applies only to samples taken after fasting for at least 8 hours.  ?Glucose, capillary     Status: Abnormal  ? Collection Time: 03/16/22  8:10 PM  ?Result Value Ref Range  ? Glucose-Capillary 160 (H) 70 - 99 mg/dL  ?  Comment: Glucose reference range applies only to samples taken after fasting for at least 8 hours.  ?Glucose, capillary     Status: Abnormal  ? Collection Time: 03/16/22 11:32 PM  ?Result Value Ref Range  ?  Glucose-Capillary 137 (H) 70 - 99 mg/dL  ?  Comment: Glucose reference range applies only to samples taken after fasting for at least 8 hours.  ? Comment 1 Notify RN   ? Comment 2 Document in Chart   ?Glucose, capillary     Status: Abnormal  ? Collection Time: 03/17/22  4:03 AM  ?Result Value Ref Range  ? Glucose-Capillary 123 (H) 70 - 99 mg/dL  ?  Comment: Glucose reference  range applies only to samples taken after fasting for at least 8 hours.  ? Comment 1 Notify RN   ? Comment 2 Document in Chart   ?CBC     Status: None  ? Collection Time: 03/17/22  5:37 AM  ?Result Value Ref

## 2022-03-18 NOTE — Progress Notes (Signed)
Occupational Therapy Treatment ?Patient Details ?Name: Paula Combs ?MRN: [id number] ?DOB: 08/02/1961 ?Today's Date: 03/18/2022 ? ? ?History of present illness The pt is a 61 yo female presenting 4/29 with slurred speech and R-sided facial droop. Imaging revealed acute intraparenchymal hemorrhage of L basal ganglia.   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 ?  ?OT comments ? Pt progressing towards goals, requiring min guard for transfers and standing ADLs at sink. Pt appearing with mild R inattention, occasionally bumping RW into objects on R side, RUE grossly [facility] for oral care, able to scan R and L side of sink for items needed. Pt presenting with impairments listed below, will follow acutely. Continue to recommend AIR at d/c.  ? ?Recommendations for follow up therapy are one component of a multi-disciplinary discharge planning process, led by the attending physician.  Recommendations may be updated based on patient status, additional functional criteria and insurance authorization. ?   ?Follow Up Recommendations ? Acute inpatient rehab (3hours/day)  ?  ?Assistance Recommended at Discharge Frequent or constant Supervision/Assistance  ?Patient can return home with the following ? A lot of help with walking and/or transfers;A lot of help with bathing/dressing/bathroom;Direct supervision/assist for medications management;Assist for transportation;Help with stairs or ramp for entrance ?  ?Equipment Recommendations ? BSC/3in1 (RW)  ?  ?Recommendations for Other Services Rehab consult ? ?  ?Precautions / Restrictions Precautions ?Precautions: Fall ?Restrictions ?Weight Bearing Restrictions: No  ? ? ?  ? ?Mobility Bed Mobility ?Overal bed mobility: Needs Assistance ?Bed Mobility: Rolling, Supine to Sit, Sit to Supine ?  ?  ?Supine to sit: Min guard, HOB elevated ?Sit to supine: Min guard ?  ?General bed mobility comments: increased time and cues for attention  to get RLE on/off bed ?  ? ?Transfers ?Overall transfer level: Needs assistance ?Equipment used: Rolling walker (2 wheels) ?Transfers: Sit to/from Stand ?Sit to Stand: Min assist ?  ?  ?  ?  ?  ?General transfer comment: Pt required hand over hand guidance/cues for proper RUE placement on RW ?  ?  ?Balance Overall balance assessment: Needs assistance ?Sitting-balance support: Feet supported, No upper extremity supported ?Sitting balance-Leahy Scale: Fair ?Sitting balance - Comments: sitting EOB with close supervision ?Postural control: Posterior lean ?Standing balance support: Bilateral upper extremity supported, During functional activity ?Standing balance-Leahy Scale: Poor ?Standing balance comment: able to maintain static standing balance with min guard/supervision but required min A for dynamic standing balance ?  ?  ?  ?  ?  ?  ?  ?  ?  ?  ?  ?   ? ?ADL either performed or assessed with clinical judgement  ? ?ADL Overall ADL's : Needs assistance/impaired ?  ?  ?Grooming: Wash/dry face;Oral care;Wash/dry hands;Standing;Supervision/safety ?Grooming Details (indicate cue type and reason): gestural cues to complete task ?  ?  ?  ?  ?  ?  ?  ?  ?Toilet Transfer: Min guard;Ambulation;Regular Toilet ?  ?  ?  ?  ?  ?Functional mobility during ADLs: Min guard;Rolling walker (2 wheels) ?  ?  ? ?Extremity/Trunk Assessment Upper Extremity Assessment ?Upper Extremity Assessment: RUE deficits/detail ?RUE Deficits / Details: difficult to fully assess due to language barrier. AROM is WFL. Strength is globally week ~3/5 MMT, slow and deliberate. pt denies sensation changes ?RUE Sensation: WNL ?RUE Coordination: decreased fine motor;decreased gross motor ?LUE Deficits / Details: difficult to fully assess due to  language barrier. AROM is WFL but slow and deliberate. Strength is globally week ~3+/5 MMT. pt denies sensation changes ?LUE Sensation: WNL ?LUE Coordination: decreased fine motor;decreased gross motor ?  ?Lower Extremity  Assessment ?Lower Extremity Assessment: Defer to PT evaluation ?  ?  ?  ? ?Vision   ?Vision Assessment?: Vision impaired- to be further tested in functional context ?Additional Comments: able to visually track in all quadrants of gaze, difficulty attending task/following instruction,  possibly due to language barrier ?  ?Perception Perception ?Perception: Not tested ?  ?Praxis Praxis ?Praxis: Not tested ?  ? ?Cognition Arousal/Alertness: Awake/alert ?Behavior During Therapy: Flat affect ?Overall Cognitive Status: Difficult to assess ?  ?  ?  ?  ?  ?  ?  ?  ?  ?  ?  ?  ?  ?  ?  ?  ?General Comments: pt non-english speaking, did follow gestural commands properly ?  ?  ?   ?Exercises   ? ?  ?Shoulder Instructions   ? ? ?  ?General Comments VSS on RA  ? ? ?Pertinent Vitals/ Pain       Pain Assessment ?Pain Assessment: No/denies pain ? ?Home Living   ?Living Arrangements:  (Lives with daughter, son in law and 3 grandchildren) ?  ?  ?  ?  ?  ?  ?  ?  ?  ?  ?  ?  ?  ?  ?  ?  ? Lives With: Daughter (and her family) ? ?  ?Prior Functioning/Environment    ?  ?  ?  ?   ? ?Frequency ? Min 2X/week  ? ? ? ? ?  ?Progress Toward Goals ? ?OT Goals(current goals can now be found in the care plan section) ? Progress towards OT goals: Progressing toward goals ? ?Acute Rehab OT Goals ?Patient Stated Goal: none stated ?OT Goal Formulation: With patient ?Time For Goal Achievement: 03/30/22 ?Potential to Achieve Goals: Good ?ADL Goals ?Pt Will Perform Grooming: with min guard assist;standing ?Pt Will Perform Lower Body Bathing: with min guard assist;sit to/from stand ?Pt Will Perform Lower Body Dressing: with min guard assist;sit to/from stand ?Pt Will Transfer to Toilet: with min guard assist;ambulating ?Additional ADL Goal #1: Pt will follow 3 step commands with increased time to complete ADLs ?Additional ADL Goal #2: Pt will demonstrate increased activity tolerance to complete at least 3 ADLs in standing with min A  ?Plan Discharge plan  remains appropriate;Frequency remains appropriate   ? ?Co-evaluation ? ? ?   ?  ?PT goals addressed during session: Mobility/safety with mobility;Balance;Proper use of DME ?  ?  ? ?  ?AM-PAC OT "6 Clicks" Daily Activity     ?Outcome Measure ? ? Help from another person eating meals?: A Little ?Help from another person taking care of personal grooming?: A Little ?Help from another person toileting, which includes using toliet, bedpan, or urinal?: A Little ?Help from another person bathing (including washing, rinsing, drying)?: A Lot ?Help from another person to put on and taking off regular upper body clothing?: A Lot ?Help from another person to put on and taking off regular lower body clothing?: A Lot ?6 Click Score: 15 ? ?  ?End of Session Equipment Utilized During Treatment: Gait belt;Rolling walker (2 wheels) ? ?OT Visit Diagnosis: Unsteadiness on feet (R26.81);Other abnormalities of gait and mobility (R26.89);Muscle weakness (generalized) (M62.81);Hemiplegia and hemiparesis ?Hemiplegia - Right/Left: Right ?Hemiplegia - dominant/non-dominant: Dominant ?Hemiplegia - caused by: Nontraumatic intracerebral hemorrhage ?  ?Activity Tolerance  Patient tolerated treatment well ?  ?Patient Left in bed;with call bell/phone within reach;with bed alarm set ?  ?Nurse Communication Mobility status ?  ? ?   ? ?Time: [id number] ?OT Time Calculation (min): 12 min ? ?Charges: OT General Charges ?$OT Visit: 1 Visit ?OT Treatments ?$Self Care/Home Management : 8-22 mins ? ?Lynnda Child, OTD, OTR/L ?Acute Rehab ?[phone] ? ? ?Kaylyn Lim ?03/18/2022, 3:23 PM ?

## 2022-03-18 NOTE — Discharge Summary (Signed)
?Physician Discharge Summary ?  ?Patient: Paula Combs MRN: [id number] DOB: 03/08/1961  ?Admit date:     03/14/2022  ?Discharge date: 03/18/22  ?Discharge Physician: Alberteen SamChristopher P Falisha Osment  ? ?PCP: [facility]  ? ? ? ?Recommendations at discharge:  ?[facility] 4 weeks ?[facility] 1 week after discharge from rehab ?Titrate antihypertensives with goal BP <130/80 within 1 week ? ? ? ? ?Discharge Diagnoses: ?Principal Problem: ?  Intracerebral hemorrhage ?Active Problems: ?  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  Hypertensive emergency ?  HTN (hypertension) ?  Hyperlipidemia ?  Dysphagia ?  Controlled type 2 diabetes mellitus without complication, without long-term current use of insulin (HCC) ?  Blindness ?  Cerebrovascular disease ? ?  ? ? ?Hospital Course: ?Paula Combs is a 61 y.o. F with Dm, blindness, ACOM aneurysm s/p coiling who presented with focal weakness. ? ?In the ER, CTH showed intraparenchymal hemorrhage without midline shift.  Started on Cleviprex and admitted to ICU.    ? ? ? ? 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  Suspected cause hypertensive emergency. ? ?Started on Cleviprex and BP controlled.  Ultimately transitioned to home oral irbesartan. ? ?Non-invasive angiography showed moderate stenosis of left M1/M2, no aneurysms, no significant carotid disease.  Echocardiogram showed no cardiogenic source of embolism, normal EF. ? ?Lipids ordered: LDL 153, Neurology held statin while inpatient due to IPH, recommended increasing atorvastatin 40 > 80 on discharge. ? ?Aspirin held due to IPH.  Atrial fibrillation: not present on monitoring.  tPA not given because of IPH.  Dysphagia screen ordered in ER.  PT eval ordered: recommended ipatietn rehab.  Smoking cessation: not  pertinent.   ? ? ? ?  ? ? ? ? ?  ? ?Consultants: Neurology ?Procedures performed: Echo, CTA head and neck, CT head  ?Disposition: [facility] ?Diet recommendation:  ?Cardiac and Carb modified diet ? ?DISCHARGE MEDICATION: ?Allergies as of 03/18/2022   ?No Known Allergies ?  ? ?  ?Medication List  ?  ? ?STOP taking these medications   ? ?ibuprofen 200 MG tablet ?Commonly known as: ADVIL ?  ? ?  ? ?TAKE these medications   ? ?alendronate 70 MG tablet ?Commonly known as: FOSAMAX ?Take 70 mg by mouth once a week. Friday ?  ?atorvastatin 40 MG tablet ?Commonly known as: LIPITOR ?Take 40 mg by mouth daily. ?  ?irbesartan 75 MG tablet ?Commonly known as: AVAPRO ?Take 75 mg by mouth daily. ?  ?Januvia 100 MG tablet ?Generic drug: sitaGLIPtin ?Take 100 mg by mouth daily. ?  ?metFORMIN 1000 MG tablet ?Commonly known as: GLUCOPHAGE ?Take 1,000 mg by mouth 2 (two) times daily. ?  ?omeprazole 40 MG capsule ?Commonly known as: PRILOSEC ?Take 40 mg by mouth daily. ?  ?timolol 0.5 % ophthalmic solution ?Commonly known as: TIMOPTIC ?Place 1 drop into both eyes 2 (two) times daily. ?  ? ?  ? ? Follow-up Information   ? ? [facility]. Schedule an appointment as soon as possible for a visit in 1 month(s).   ?Specialty: Neurology ?Why: stroke clinic ?Contact information: ?[address] ?[place] [id number] ?[phone] ? ?  ?  ? ? [facility]. Schedule an appointment as soon as possible for a visit in 1 week(s).   ?Contact information: ?[address] ?[place] [address] ?[phone] ? ? ?  ?  ? ?  ?  ? ?  ? ? ?Discharge Instructions   ? ? Ambulatory referral to Neurology   Complete by: As directed ?  ? Follow up with stroke clinic NP (Jessica El Segundo or Zykiria Bruening, if both not available, consider Melayah, Skorupski, or Ahern) at [facility] in about 4 weeks. Thanks.  ? ?  ? ? ?Discharge Exam: ?Filed Weights  ? 03/14/22 1611 03/14/22 1807  ?Weight: 64.4 kg 58.1 kg   ? ? ?General: Pt is alert, awake, not in acute distress, sitting up in bed, eating applesauce.  Attempted to use both [name] telephonic interpreter (stated the patient responded to her in [place]) then [place] videophonic interpreter (stated the pateitn stared at her, did not respond intellgibly). ?Cardiovascular: RRR, nl S1-S2, no murmurs appreciated.   No LE edema.   ?Respiratory: Normal respiratory rate and rhythm.  CTAB without rales or wheezes. ?Abdominal: Abdomen soft without grimace to palpation.  No distension or HSM.   ?Neuro/Psych: Turns toward voice, but appears blind.  Strength seems symmetric and she seems to resist me in both upper extremities.  Does not follow commands for CN testing nor for leg strength testing or cerebellary testing.  The interpreters and I are unsure if she is responding to any questions intellgibly.    ? ?Condition at discharge: stable ? ?The results of significant diagnostics from this hospitalization (including imaging, microbiology, ancillary and laboratory) are listed below for reference.  ? ?Imaging Studies: ?CT ANGIO HEAD NECK W WO CM ? ?Result Date: 03/14/2022 ?CLINICAL DATA:  Dizziness, persistent/recurrent, cardiac or vascular cause suspected Stroke/TIA, determine embolic source EXAM: CT ANGIOGRAPHY HEAD AND NECK TECHNIQUE: Multidetector CT imaging of the head and neck was performed using the standard protocol during bolus administration of intravenous contrast. Multiplanar CT image reconstructions and MIPs were obtained to evaluate the vascular anatomy. Carotid stenosis measurements (when applicable) are obtained utilizing NASCET criteria, using the distal internal carotid diameter as the denominator. RADIATION DOSE REDUCTION: This exam was performed according to the departmental dose-optimization program which includes automated exposure control, adjustment of the [place] and/or kV according to patient size and/or use of iterative reconstruction technique. CONTRAST:   OMNIPAQUE IOHEXOL 350 MG/ML SOLN COMPARISON:  None. FINDINGS: CT HEAD Brain: Acute parenchymal hemorrhage centered within the left lentiform nucleus measuring about 2.1 x 1.5 x 2.1 cm. There is some ill-defined hemorrhage tracking superiorly toward the caudate. Mild surrounding edema. No significant mass effect. No evidence of intraventricular extension. Prominence of ventricles and sulci reflects parenchymal volume loss. Patchy and confluent hypoattenuation in the supratentorial white matter is nonspecific but probably reflects moderate chronic microvascular ischemic changes. Vascular: Streak artifact form coil embolization in the anterior communicating artery region. Skull: Unremarkable. Sinuses/Orbits: Partially imaged extensive paranasal sinus opacification with chronic maxillary mucoperiosteal thickening. Unremarkable orbits. Other: Bilateral patchy mastoid and middle ear opacification. Review of the MIP images confirms the above findings CTA NECK Aortic arch: Trace calcified plaque. Great vessel origins are patent Right carotid system: Patent.  No stenosis. Left carotid system: Patent.  No stenosis. Vertebral arteries: Patent and codominant.  No stenosis. Skeleton: Degenerative changes at C5-C6 and C6-C7. Other neck: Unremarkable. Upper chest: No apical lung mass. Review of the MIP images confirms the above findings CTA HEAD Anterior circulation: Intracranial internal carotid arteries are patent. Anterior cerebral arteries are patent. 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 Anterior communicating artery region is partially  obscured by artifact. No definite residual or recurrent aneurysm. Middle cerebral arteries are patent. There is eccentric noncalcified plaque along the left M1/M2 MCA as it curves posteriorly causing mild to moderate stenosis. There is no abnormal vascularity in the region hemorrhage. Posterior circulation: Intracranial vertebral arteries, basilar artery, and  posterior cerebral arteries are patent. Venous sinuses: Patent as allowed by contrast bolus timing. Review of the MIP images confirms the above findings IMPRESSION: Acute parenchymal hemorrhage involving the left

## 2022-03-18 NOTE — Progress Notes (Signed)
Physical Therapy Treatment ?Patient Details ?Name: Paula Combs ?MRN: [id number] ?DOB: 04/07/1961 ?Today's Date: 03/18/2022 ? ? ?History of Present Illness The pt is a 61 yo female presenting 4/29 with slurred speech and R-sided facial droop. Imaging revealed acute intraparenchymal hemorrhage of L basal ganglia.   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 ? ?  ?PT Comments  ? ? Pt seen for co-treatment with OT. Pt performed bed mobility with min guard and transfers with RW and min A throughout session. Pt is progressing well with ambulation but requires tactile cues and guidance for positioning of RUE on RW as well at to increased R step length. Continue to recommend AIR. Acute PT to cont to follow.  ?   ?Recommendations for follow up therapy are one component of a multi-disciplinary discharge planning process, led by the attending physician.  Recommendations may be updated based on patient status, additional functional criteria and insurance authorization. ? ?Follow Up Recommendations ? Acute inpatient rehab (3hours/day) ?  ?  ?Assistance Recommended at Discharge Frequent or constant Supervision/Assistance  ?Patient can return home with the following A lot of help with bathing/dressing/bathroom;Assistance with cooking/housework;Direct supervision/assist for financial management;Direct supervision/assist for medications management;Assist for transportation;Help with stairs or ramp for entrance;A little help with walking and/or transfers ?  ?Equipment Recommendations ? Rolling walker (2 wheels)  ?  ?Recommendations for Other Services Rehab consult ? ? ?  ?Precautions / Restrictions Precautions ?Precautions: Fall ?Restrictions ?Weight Bearing Restrictions: No  ?  ? ?Mobility ? Bed Mobility ?Overal bed mobility: Needs Assistance ?Bed Mobility: Rolling, Supine to Sit, Sit to Supine ?  ?  ?Supine to sit: Min guard, HOB elevated ?Sit to supine: Min guard ?   ?General bed mobility comments: increased time and cues for attention to get RLE on/off bed ?Patient Response: Flat affect, Cooperative ? ?Transfers ?Overall transfer level: Needs assistance ?Equipment used: Rolling walker (2 wheels) ?Transfers: Sit to/from Stand ?Sit to Stand: Min assist ?  ?  ?  ?  ?  ?General transfer comment: Pt required hand over hand guidance/cues for proper RUE placement on RW ?  ? ?Ambulation/Gait ?Ambulation/Gait assistance: Min assist, +2 safety/equipment (recliner follow) ?Gait Distance (Feet): 75 Feet ?Assistive device: Rolling walker (2 wheels) ?Gait Pattern/deviations: Decreased stride length, Step-through pattern, Decreased step length - right, Narrow base of support, Trunk flexed ?Gait velocity: decreased ?Gait velocity interpretation: <1.8 ft/sec, indicate of risk for recurrent falls ?  ?General Gait Details: pt required visual demonstration to increase R step length ? ? ?Stairs ?  ?  ?  ?  ?  ? ? ?Wheelchair Mobility ?  ? ?Modified Rankin (Stroke Patients Only) ?Modified Rankin (Stroke Patients Only) ?Pre-Morbid Rankin Score: No symptoms ?Modified Rankin: Moderately severe disability ? ? ?  ?Balance Overall balance assessment: Needs assistance ?Sitting-balance support: Feet supported, No upper extremity supported ?Sitting balance-Leahy Scale: Fair ?Sitting balance - Comments: sitting EOB with close supervision ?  ?Standing balance support: Bilateral upper extremity supported, During functional activity ?Standing balance-Leahy Scale: Poor ?Standing balance comment: able to maintain static standing balance with min guard/supervision but required min A for dynamic standing balance ?  ?  ?  ?  ?  ?  ?  ?  ?  ?  ?  ?  ? ?  ?Cognition Arousal/Alertness: Awake/alert ?Behavior During Therapy: Flat affect ?Overall Cognitive Status: Difficult to assess ?  ?  ?  ?  ?  ?  ?  ?  ?  ?  ?  ?  ?  ?  ?  ?  ?  General Comments: pt non-english speaking, did follow gestural commands properly ?  ?  ? ?   ?Exercises   ? ?  ?General Comments   ?  ?  ? ?Pertinent Vitals/Pain Pain Assessment ?Pain Assessment: No/denies pain  ? ? ?Home Living   ?  ?  ?  ?  ?  ?  ?  ?  ?  ?   ?  ?Prior Function    ?  ?  ?   ? ?PT Goals (current goals can now be found in the care plan section) Acute Rehab PT Goals ?Patient Stated Goal: none stated ?PT Goal Formulation: With patient ?Time For Goal Achievement: 03/29/22 ?Potential to Achieve Goals: Good ?Progress towards PT goals: Progressing toward goals ? ?  ?Frequency ? ? ? Min 4X/week ? ? ? ?  ?PT Plan Current plan remains appropriate  ? ? ?Co-evaluation   ?  ?PT goals addressed during session: Mobility/safety with mobility;Balance;Proper use of DME ?  ?  ? ?  ?AM-PAC PT "6 Clicks" Mobility   ?Outcome Measure ? Help needed turning from your back to your side while in a flat bed without using bedrails?: A Little ?Help needed moving from lying on your back to sitting on the side of a flat bed without using bedrails?: A Little ?Help needed moving to and from a bed to a chair (including a wheelchair)?: A Little ?Help needed standing up from a chair using your arms (e.g., wheelchair or bedside chair)?: A Little ?Help needed to walk in hospital room?: A Little ?Help needed climbing 3-5 steps with a railing? : A Lot ?6 Click Score: 17 ? ?  ?End of Session Equipment Utilized During Treatment: Gait belt ?Activity Tolerance: Patient tolerated treatment well ?Patient left: in bed;with call bell/phone within reach;with bed alarm set ?Nurse Communication: Mobility status ?PT Visit Diagnosis: Other abnormalities of gait and mobility (R26.89);Muscle weakness (generalized) (M62.81) ?  ? ? ?Time: [id number] ?PT Time Calculation (min) (ACUTE ONLY): 12 min ? ?Charges:  $Gait Training: 8-22 mins          ?          ? ?Becky Sax PT, DPT  ?[name] ?03/18/2022, 2:03 PM ? ?

## 2022-03-18 NOTE — H&P (Signed)
? ? ?Physical Medicine and Rehabilitation Admission H&P ? ?  ?CC: Functional deficits secondary to subcortical, non-traumatic ICH ? ?HPI: Paula Combs is a 61 year old female who awoke with confusion, slurred speech and right facial droop on 03/14/2022. She presented to [employer] by way of family transportation. Patient speaks a dialect of [name] and her daughter and son-in-law provide assistance with interpretation. She was outside the window for tenecteplase.  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 Neurology, Dr. Selina Cooley, consulted. Admitted to ICU on clevidipine, followed by Dr. Roda Shutters. Unable to obtain MRI due to presence of aneurysm coil. Right leg weaker than left on neurology initial assessment as well as non-verbal. Home meds restarted 5/1. MBS performed now on dysphagia 3 thin liquid diet. Transferred out of ICU on 5/2 to hospitalist service. SSI continues. ? ?Family relates previous stroke in either 2004 or 2005 with difficulty ambulating which improved over time. Right leg weaker than left on neurology initial assessment as well as non-verbal. 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 On ARB for hypertension. Previous anterior communicating artery aneurysm s/p coil and therefore unable to obtain MRI. ? ?Resides in [place]. No tobacco or alcohol use. ? ?EF: 60-65% ?LDL = 153 ?A1C = 5.7 ? ?PCP: ? ?Ophthalmologist: Nilda Riggs, MD ? ?Needs assistance with eating. ? ?Review of Systems  ?Eyes:   ?     Blind right eye  ?Neurological:   ?     Right sided weakness  ?Past Medical History:  ?Diagnosis Date  ? Controlled type 2 diabetes mellitus without complication, without long-term current use of insulin (HCC) 03/17/2022  ? HTN (hypertension) 03/17/2022  ? Hyperlipidemia 03/17/2022  ? ? ?Social History:  has no history on file for tobacco use, alcohol use, and drug use. ?Allergies: No Known Allergies ?Medications Prior to Admission  ?Medication Sig Dispense Refill  ?  alendronate (FOSAMAX) 70 MG tablet Take 70 mg by mouth once a week. Friday    ? atorvastatin (LIPITOR) 80 MG tablet Take 1 tablet (80 mg total) by mouth daily.    ? irbesartan (AVAPRO) 75 MG tablet Take 75 mg by mouth daily.    ? JANUVIA 100 MG tablet Take 100 mg by mouth daily.    ? metFORMIN (GLUCOPHAGE) 1000 MG tablet Take 1,000 mg by mouth 2 (two) times daily.    ? omeprazole (PRILOSEC) 40 MG capsule Take 40 mg by mouth daily.    ? timolol (TIMOPTIC) 0.5 % ophthalmic solution Place 1 drop into both eyes 2 (two) times daily.    ? ?Home: ?Home Living ?Family/patient expects to be discharged to:: Private residence ?Living Arrangements: Children ?Available Help at Discharge: Family, Available 24 hours/day ?Type of Home: House ?Home Access: Level entry ?Home Layout: Two level, Able to live on main level with bedroom/bathroom ?Bathroom Shower/Tub: Tub/shower unit ?Bathroom Toilet: Standard ?Bathroom Accessibility: Yes ?Home Equipment: None ?Additional Comments: son in law provided information ? Lives With: Daughter (and her family) ?  ?Functional History: ?Prior Function ?Prior Level of Function : Needs assist ?Mobility Comments: no AD ?ADLs Comments: son in law states she "sometimes" needs assist, did not detail further. pt's daughter is home with pt 24/7 ?  ?Functional Status:  ?Mobility: ?Bed Mobility ?Overal bed mobility: Needs Assistance ?Bed Mobility: Supine to Sit ?Supine to sit: Min assist ?Sit to supine: Min assist ?General bed mobility comments: with demonstration pt initiated transfer, minA for LE management off EOB, suspect more of a  language barrier vs inability to physical perform ?Transfers ?Overall transfer level: Needs assistance ?Equipment used: 2 person hand held assist ?Transfers: Sit to/from Stand ?Sit to Stand: Min assist, +2 physical assistance, +2 safety/equipment ?General transfer comment: max tactile cues with demonstration, pt with noted R sided weakness ?Ambulation/Gait ?Ambulation/Gait  assistance: Mod assist, +2 physical assistance ?Gait Distance (Feet): 60 Feet ?Assistive device: 2 person hand held assist ?Gait Pattern/deviations: Step-through pattern, Decreased stride length ?General Gait Details: pt requiring tactile cues to initiate movement, pt with noted R sided weaknes with R LE instability/antalgia, pt with improved stability with rehab tech infront holding patients hand in the front, and PT stabilizing hips posteriorly ?Gait velocity: dec ?Gait velocity interpretation: <1.31 ft/sec, indicative of household ambulator ?  ?ADL: ?ADL ?Overall ADL's : Needs assistance/impaired ?Eating/Feeding: NPO ?Grooming: Moderate assistance, Sitting ?Upper Body Bathing: Moderate assistance, Sitting ?Lower Body Bathing: Maximal assistance, Sit to/from stand ?Upper Body Dressing : Moderate assistance, Sitting ?Lower Body Dressing: Maximal assistance, Sit to/from stand ?Toilet Transfer: Minimal assistance, +2 for physical assistance, +2 for safety/equipment ?Toilet Transfer Details (indicate cue type and reason): 2 person hand hold ?Toileting- Clothing Manipulation and Hygiene: Minimal assistance, Sitting/lateral lean ?Functional mobility during ADLs: Minimal assistance, +2 for safety/equipment, +2 for physical assistance ?General ADL Comments: limited by language barrier, global weakness, slow processing ?  ?Cognition: ?Cognition ?Overall Cognitive Status: Difficult to assess ?Orientation Level: Oriented to person, Oriented to place, Disoriented to time, Disoriented to situation ?Cognition ?Arousal/Alertness: Awake/alert ?Behavior During Therapy: Flat affect ?Overall Cognitive Status: Difficult to assess ?General Comments: pt non-english speaking, non-verbal but did follow gestural commands properly ?Difficult to assess due to: Non-English speaking (no family available, interpreter ipad doens't have the language "[name]" available.) ? ?Physical Exam: ?Height 4\' 11"  (1.499 m), weight 62.2 kg. ?Physical  Exam ?Gen: no distress, normal appearing ?HEENT: oral mucosa pink and moist, NCAT ?Cardio: Reg rate ?Chest: normal effort, normal rate of breathing ?Abd: soft, non-distended ?Ext: no edema ?Psych: pleasant, normal affect ?Skin: intact ?Neuro/MSK: 3/5 strength b/l LE, 4/5 b/l UE. Sensation intact. Needs assistance to set up her lunch but can spoon soup into mouth with right hand without spillage ? ?Results for orders placed or performed during the hospital encounter of 03/14/22 (from the past 48 hour(s))  ?Glucose, capillary     Status: Abnormal  ? Collection Time: 03/16/22  8:10 PM  ?Result Value Ref Range  ? Glucose-Capillary 160 (H) 70 - 99 mg/dL  ?  Comment: Glucose reference range applies only to samples taken after fasting for at least 8 hours.  ?Glucose, capillary     Status: Abnormal  ? Collection Time: 03/16/22 11:32 PM  ?Result Value Ref Range  ? Glucose-Capillary 137 (H) 70 - 99 mg/dL  ?  Comment: Glucose reference range applies only to samples taken after fasting for at least 8 hours.  ? Comment 1 Notify RN   ? Comment 2 Document in Chart   ?Glucose, capillary     Status: Abnormal  ? Collection Time: 03/17/22  4:03 AM  ?Result Value Ref Range  ? Glucose-Capillary 123 (H) 70 - 99 mg/dL  ?  Comment: Glucose reference range applies only to samples taken after fasting for at least 8 hours.  ? Comment 1 Notify RN   ? Comment 2 Document in Chart   ?CBC     Status: None  ? Collection Time: 03/17/22  5:37 AM  ?Result Value Ref Range  ? WBC 6.7 4.0 - 10.5 K/uL  ? RBC  3.91 3.87 - 5.11 MIL/uL  ? Hemoglobin 12.4 12.0 - 15.0 g/dL  ? HCT 36.7 36.0 - 46.0 %  ? MCV 93.9 80.0 - 100.0 fL  ? MCH 31.7 26.0 - 34.0 pg  ? MCHC 33.8 30.0 - 36.0 g/dL  ? RDW 11.9 11.5 - 15.5 %  ? Platelets 362 150 - 400 K/uL  ? nRBC 0.0 0.0 - 0.2 %  ?  Comment: Performed at [facility], [address] [place], [place] [id number]  ?Basic metabolic panel     Status: Abnormal  ? Collection Time: 03/17/22  5:37 AM  ?Result Value Ref Range  ?  Sodium 137 135 - 145 mmol/L  ? Potassium 3.7 3.5 - 5.1 mmol/L  ? Chloride 110 98 - 111 mmol/L  ? CO2 20 (L) 22 - 32 mmol/L  ? Glucose, Bld 123 (H) 70 - 99 mg/dL  ?  Comment: Glucose reference range applies only to [facility]

## 2022-03-18 NOTE — TOC Transition Note (Signed)
Transition of Care (TOC) - CM/SW Discharge Note ? ? ?Patient Details  ?Name: Paula Combs ?MRN: [id number] ?Date of Birth: 1961-03-16 ? ?Transition of Care (TOC) CM/SW Contact:  ?Kermit Balo, RN ?Phone Number: ?03/18/2022, 2:29 PM ? ? ?Clinical Narrative:    ?Patient is discharging to CIR today. CM signing off.  ? ? ?Final next level of care: IP Rehab Facility ?Barriers to Discharge: No Barriers Identified ? ? ?Patient Goals and CMS Choice ?  ?  ?Choice offered to / list presented to : Patient, Adult Children ? ?Discharge Placement ?  ?           ?  ?  ?  ?  ? ?Discharge Plan and Services ?  ?  ?           ?  ?  ?  ?  ?  ?  ?  ?  ?  ?  ? ?Social Determinants of Health (SDOH) Interventions ?  ? ? ?Readmission Risk Interventions ?   ? View : No data to display.  ?  ?  ?  ? ? ? ? ? ?

## 2022-03-18 NOTE — Progress Notes (Signed)
Horton Chin, MD  ?Physician ?Physical Medicine and Rehabilitation ?PMR Pre-admission     ?Signed ?Date of Service:  03/18/2022  2:10 PM ? Related encounter: ED to Hosp-Admission (Current) from 03/14/2022 in [facility] ?  ?Signed    ?  ?Show:Clear all ?[x] Written[x] Templated[x] Copied ? ?Added by: ?[x] , RN[x] Raulkar, , MD ? ?[] Hover for details ?   ?   ?   ?   ?   ?   ?   ?   ?   ?   ?   ?   ?   ?   ?   ?   ?   ?   ?   ?   ?   ?   ?   ?   ?   ?   ?   ?   ?   ?   ?   ?   ?   ?   ?   ?   ?   ?   ?   ?   ?   ?   ?   ?   ?   ?   ?   ?   ?   ?   ?   ?   ?   ?   ?   ?   ?   ?   ?   ?   ?   ?   ?   ?   ?   ?   ?   ?   ?   ?   ?   ?   ?   ?   ?   ?   ?   ?   ?   ?   ?   ?   ?   ?   ?   ?   ?   ?   ?   ?   ?   ?   ?   ?   ?   ?   ?   ?   ?   ?   ?   ?   ?   ?   ?   ?   ?   ?   ?   ?   ?   ?   ?   ?   ?   ?   ?   ?   ?   ?   ?   ?   ?   ?   ?   ?   ?   ?   ?   ?   ?   ?   ?   ?   ?   ?   ?   ?   ?   ?PMR Admission Coordinator Pre-Admission Assessment ?  ?Patient: Paula Combs is an 61 y.o., female ?MRN: ?DOB: May 30, 1961 ?Height: 4\' 11"  (149.9 cm) ?Weight: 58.1 kg ?  ?Insurance Information ?HMO:     PPO:      PCP:      IPA:      80/20:      OTHER:  ?PRIMARY: [place] medicaid BCBS Healthy Blue      Policy#: [id number]      Subscriber: pt ?CM Name: via fax      Phone#: [phone]   Fax#: [phone] ?Pre-Cert#: ID# ref # [id number]  approved until 03/24/22    Employer:  ?Benefits:  Phone #: [phone]  Name: 5/2 ?Eff. Date: 05/16/2020     Deduct: none      Out of Pocket Max: none ?CIR: per medicaid guidelines      SNF: per medicaid ?Outpatient: per medicaid     Co-Pay:  ?Home Health: per medicaid      Co-Pay:  ?DME: per medicaid     Co-Pay:  ?Providers: in network ? ?SECONDARY: none ?  ?Financial Counselor:       Phone#:  ?  ?The ?Data Collection Information Summary? for patients in Inpatient Rehabilitation Facilities with attached ?Privacy Act Statement-Health  Care Records? was provided and verbally reviewed with: N/A ?  ?Emergency Contact Information ?Contact Information   ?  ?  Name Relation Home Work Mobile  ?  Margot ChimesWah,K Shaw Other     [phone]  ?  ?   ?  ?Current Medical History  ?Patient Admitting Diagnosis: ICH ?  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 Awoke with confusion, slurred speech and right facial droop . She presented to [facility] by way of family transportation on 03/14/22.  She was outside the window for tenecteplase.  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 Neurology, Dr. Selina CooleyStack, consulted.Unable to perform MRI due to aneurysm coiling.  Admitted to ICU on clevidipine, followed by Dr. Roda ShuttersXu.   MBS performed now on dysphagia 3 thin liquid diet. No antithrombotic pta and none due to IPH now. Home meds of irbesartan and to resume after removed cleviprex. For labetolol prn. Home meds of atorvastatin with LDL 153. To increase at discharge. Hgb A1c 5.7 on SSI.   ?  ?Complete NIHSS TOTAL: 3 ?  ?[facility] been reviewed by the rehabilitation admission coordinator and physician. ?  ?Past Medical History  ?    ?Past Medical History:  ?Diagnosis Date  ? Controlled type 2 diabetes mellitus without complication, without long-term current use of insulin (HCC) 03/17/2022  ? HTN (hypertension) 03/17/2022  ? Hyperlipidemia 03/17/2022  ?  ?Has the patient had major surgery during 100 days prior to admission? No ?  ?Family History   ?family history is not on file. ?  ?Current Medications ?  ?Current Facility-Administered Medications:  ?  acetaminophen (TYLENOL) tablet 650 mg, 650 mg, Oral, Q4H PRN **OR** acetaminophen (TYLENOL) 160 MG/5ML solution 650 mg, 650 mg, Per Tube, Q4H PRN **OR** acetaminophen (TYLENOL) suppository 650 mg, 650 mg, Rectal, Q4H PRN, Bhagat, Srishti L,  MD, 650 mg at 03/15/22 1435 ?  Chlorhexidine Gluconate Cloth 2 % PADS 6 each, 6 each, Topical, Daily, Marvel PlanXu, Jindong, MD, 6 each at 03/17/22 0919 ?  insulin aspart (novoLOG) injection 0-15 Units, 0-15 Units, Subcutaneous, Q4H, Bhagat, Srishti L, MD, 8 Units at 03/18/22 1221 ?  irbesartan (AVAPRO) tablet 75 mg, 75 mg, Oral, Daily, Shafer, Devon, NP, 75 mg at 03/18/22 0930 ?  labetalol (NORMODYNE) injection 10 mg, 10 mg, Intravenous, Q2H PRN, Elmer PickerShafer, Devon, NP, 10 mg at 03/16/22 1013 ?  pantoprazole (PROTONIX) EC tablet 40 mg, 40 mg, Oral, Daily, Marvel PlanXu, Jindong, MD, 40 mg at 03/18/22 0931 ?  senna-docusate (Senokot-S) tablet 1 tablet, 1 tablet, Oral, BID, Bhagat, Srishti L, MD, 1 tablet at 03/17/22 0919 ?  timolol (TIMOPTIC) 0.5 % ophthalmic solution 1 drop, 1 drop, Both Eyes, BID, Bhagat, Srishti L, MD, 1 drop at 03/18/22  9242 ?  ?Patients Current Diet:  ?Diet Order   ?  ?         ?    DIET DYS 3 Room service appropriate? Yes; Fluid consistency: Thin  Diet effective now       ?  ?  ?   ?  ?  ?   ?  ?Precautions / Restrictions ?Precautions ?Precautions: Fall ?Restrictions ?Weight Bearing Restrictions: No  ?  ?Has the patient had 2 or more falls or a fall with injury in the past year? No ?  ?Prior Activity Level ?Community (5-7x/wk): Independent without AD ?  ?Prior Functional Level ?Self Care: Did the patient need help bathing, dressing, using the toilet or eating? Independent ?  ?Indoor Mobility: Did the patient need assistance with walking from room to room (with or without device)? Independent ?  ?Stairs: Did the patient need assistance with internal or external stairs (with or without device)? Independent ?  ?Functional Cognition: Did the patient need help planning regular tasks such as shopping or remembering to take medications? Independent ?  ?Patient Information ?Are you of Hispanic, Latino/a,or Spanish origin?: A. No, not of Hispanic, Latino/a, or Spanish origin ?What is your race?: J. 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 Unable to respond ?  ?Patient's Response To:  ?Health Literacy and Transportation ?Is the patient able to respond to health literacy and transportation needs?: No ?Health Literacy - How often do you need to have someone help you when you read instructions, pamphlets, or other written material from your doctor or pharmacy?: Patient unable to respond ?In the past 12 months, has lack of transportation kept you from medical appointments or from getting medications?: No ?In the past 12 months, has lack of transportation kept you from meetings, work, or from getting things needed for daily living?: No ?  ?Home Assistive Devices / Equipment ?Home Equipment: None ?  ?Prior Device Use: Indicate devices/aids used by the patient prior to current illness, exacerbation or injury? None of the above ?  ?Current Functional Level ?Cognition ?  Overall Cognitive Status: Difficult to assess ?Difficult to assess due to: Non-English speaking (no family available, interpreter ipad doesn't have the language "[name]" available.) ?Orientation Level: Oriented to person, Oriented to place, Disoriented to time, Disoriented to situation ?General Comments: pt non-english speaking, did follow gestural commands properly ?   ?Extremity Assessment ?(includes Sensation/Coordination) ?  Upper Extremity Assessment: RUE deficits/detail, LUE deficits/detail ?RUE Deficits / Details: difficult to fully assess due to language barrier. AROM is WFL. Strength is globally week ~3/5 MMT, slow and deliberate. pt denies sensation changes ?RUE Sensation: WNL ?RUE Coordination: decreased fine motor, decreased gross motor ?LUE Deficits / Details: difficult to fully assess due to language barrier. AROM is WFL but slow and deliberate. Strength is globally week ~3+/5 MMT. pt denies sensation changes ?LUE Sensation: WNL ?LUE Coordination: decreased fine motor, decreased  gross motor  ?Lower Extremity Assessment: Defer to PT evaluation ?RLE Deficits / Details: grossly functional against gravity, no buckling in stance, but poor advancement with stepping along EOB ?RLE Coord

## 2022-03-18 NOTE — PMR Pre-admission (Signed)
PMR Admission Coordinator Pre-Admission Assessment ? ?Patient: Paula Combs is an 61 y.o., female ?MRN: [id number] ?DOB: 1961/10/06 ?Height: 4\' 11"  (149.9 cm) ?Weight: 58.1 kg ? ?Insurance Information ?HMO:     PPO:      PCP:      IPA:      80/20:      OTHER:  ?PRIMARY: [place] medicaid BCBS Healthy Blue      Policy#:      Subscriber: pt ?CM Name: via fax      Phone#: [phone]   Fax#: [phone] ?Pre-Cert#: ID# [id number] # [id number]  approved until 03/24/22    Employer:  ?Benefits:  Phone #: [phone]     Name: 5/2 ?Eff. Date: 05/16/2020     Deduct: none      Out of Pocket Max: none ?CIR: per medicaid guidelines      SNF: per medicaid ?Outpatient: per medicaid     Co-Pay:  ?Home Health: per medicaid      Co-Pay:  ?DME: per medicaid     Co-Pay:  ?Providers: in network ? ?SECONDARY: none ? ?Financial Counselor:       Phone#:  ? ?The ?Data Collection Information Summary? for patients in Inpatient Rehabilitation Facilities with attached ?Privacy Act Statement-Health Care Records? was provided and verbally reviewed with: N/A ? ?Emergency Contact Information ?Contact Information   ? ? Name Relation Home Work Mobile  ? 07/17/2020   [phone]  ? ?  ? ?Current Medical History  ?Patient Admitting Diagnosis: ICH ? 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 Awoke with confusion, slurred speech and right facial droop . She presented to [facility] by way of family transportation on 03/14/22.  She was outside the window for tenecteplase.  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 Neurology, Dr. 03/16/22, consulted.Unable to perform MRI due to aneurysm coiling.  Admitted to ICU on clevidipine, followed by Dr. Selina Cooley.   MBS performed now on dysphagia 3 thin liquid diet. No antithrombotic pta and none due to IPH now. Home meds of irbesartan and to resume  after removed cleviprex. For labetolol prn. Home meds of atorvastatin with LDL 153. To increase at discharge. Hgb A1c 5.7 on SSI.   ? ?Complete NIHSS TOTAL: 3 ? ?Patient's medical record from [facility] has been reviewed by the rehabilitation admission coordinator and physician. ? ?Past Medical History  ?Past Medical History:  ?Diagnosis Date  ? Controlled type 2 diabetes mellitus without complication, without long-term current use of insulin (HCC) 03/17/2022  ? HTN (hypertension) 03/17/2022  ? Hyperlipidemia 03/17/2022  ? ?Has the patient had major surgery during 100 days prior to admission? No ? ?Family History   ?family history is not on file. ? ?Current Medications ? ?Current Facility-Administered Medications:  ?  acetaminophen (TYLENOL) tablet 650 mg, 650 mg, Oral, Q4H PRN **OR** acetaminophen (TYLENOL) 160 MG/5ML solution 650 mg, 650 mg, Per Tube, Q4H PRN **OR** acetaminophen (TYLENOL) suppository 650 mg, 650 mg, Rectal, Q4H PRN, Bhagat, Srishti L, MD, 650 mg at 03/15/22 1435 ?  Chlorhexidine Gluconate Cloth 2 % PADS 6 each, 6 each, Topical, Daily, 03/17/22, MD, 6 each at 03/17/22 0919 ?  insulin aspart (novoLOG) injection 0-15 Units, 0-15 Units, Subcutaneous, Q4H, Bhagat, Srishti L, MD, 8 Units at 03/18/22 1221 ?  irbesartan (AVAPRO) tablet 75  mg, 75 mg, Oral, Daily, Shafer, [place], NP, 75 mg at 03/18/22 0930 ?  labetalol (NORMODYNE) injection 10 mg, 10 mg, Intravenous, Q2H PRN, Elmer Picker, NP, 10 mg at 03/16/22 1013 ?  pantoprazole (PROTONIX) EC tablet 40 mg, 40 mg, Oral, Daily, Marvel Plan, MD, 40 mg at 03/18/22 0931 ?  senna-docusate (Senokot-S) tablet 1 tablet, 1 tablet, Oral, BID, Bhagat, Srishti L, MD, 1 tablet at 03/17/22 0919 ?  timolol (TIMOPTIC) 0.5 % ophthalmic solution 1 drop, 1 drop, Both Eyes, BID, Bhagat, Srishti L, MD, 1 drop at 03/18/22 0929 ? ?Patients Current Diet:  ?Diet Order   ? ?       ?  DIET DYS 3 Room service appropriate? Yes; Fluid consistency: Thin  Diet effective now        ?  ? ?  ?  ? ?  ? ?Precautions / Restrictions ?Precautions ?Precautions: Fall ?Restrictions ?Weight Bearing Restrictions: No  ? ?Has the patient had 2 or more falls or a fall with injury in the past year? No ? ?Prior Activity Level ?Community (5-7x/wk): Independent without AD ? ?Prior Functional Level ?Self Care: Did the patient need help bathing, dressing, using the toilet or eating? Independent ? ?Indoor Mobility: Did the patient need assistance with walking from room to room (with or without device)? Independent ? ?Stairs: Did the patient need assistance with internal or external stairs (with or without device)? Independent ? ?Functional Cognition: Did the patient need help planning regular tasks such as shopping or remembering to take medications? Independent ? ?Patient Information ?Are you of Hispanic, Latino/a,or Spanish origin?: A. No, not of Hispanic, Latino/a, or Spanish origin ?What is your race?: J. Other Asian ([place] refugee since 2007) ?Do you need or want an interpreter to communicate with a doctor or health care staff?: 9. Unable to respond ? ?Patient's Response To:  ?Health Literacy and Transportation ?Is the patient able to respond to health literacy and transportation needs?: No ?Health Literacy - How often do you need to have someone help you when you read instructions, pamphlets, or other written material from your doctor or pharmacy?: Patient unable to respond ?In the past 12 months, has lack of transportation kept you from medical appointments or from getting medications?: No ?In the past 12 months, has lack of transportation kept you from meetings, work, or from getting things needed for daily living?: No ? ?Home Assistive Devices / Equipment ?Home Equipment: None ? ?Prior Device Use: Indicate devices/aids used by the patient prior to current illness, exacerbation or injury? None of the above ? ?Current Functional Level ?Cognition ? Overall Cognitive Status: Difficult to assess ?Difficult to  assess due to: Non-English speaking (no family available, interpreter ipad doesn't have the language "[name]" available.) ?Orientation Level: Oriented to person, Oriented to place, Disoriented to time, Disoriented to situation ?General Comments: pt non-english speaking, did follow gestural commands properly ?   ?Extremity Assessment ?(includes Sensation/Coordination) ? Upper Extremity Assessment: RUE deficits/detail, LUE deficits/detail ?RUE Deficits / Details: difficult to fully assess due to language barrier. AROM is WFL. Strength is globally week ~3/5 MMT, slow and deliberate. pt denies sensation changes ?RUE Sensation: WNL ?RUE Coordination: decreased fine motor, decreased gross motor ?LUE Deficits / Details: difficult to fully assess due to language barrier. AROM is WFL but slow and deliberate. Strength is globally week ~3+/5 MMT. pt denies sensation changes ?LUE Sensation: WNL ?LUE Coordination: decreased fine motor, decreased gross motor  ?Lower Extremity Assessment: Defer to PT evaluation ?RLE Deficits / Details: grossly  functional against gravity, no buckling in stance, but poor advancement with stepping along EOB ?RLE Coordination: decreased gross motor  ?  ?ADLs ? Overall ADL's : Needs assistance/impaired ?Eating/Feeding: NPO ?Grooming: Moderate assistance, Sitting ?Upper Body Bathing: Moderate assistance, Sitting ?Lower Body Bathing: Maximal assistance, Sit to/from stand ?Upper Body Dressing : Moderate assistance, Sitting ?Lower Body Dressing: Maximal assistance, Sit to/from stand ?Toilet Transfer: Minimal assistance, +2 for physical assistance, +2 for safety/equipment ?Toilet Transfer Details (indicate cue type and reason): 2 person hand hold ?Toileting- Clothing Manipulation and Hygiene: Minimal assistance, Sitting/lateral lean ?Functional mobility during ADLs: Minimal assistance, +2 for safety/equipment, +2 for physical assistance ?General ADL Comments: limited by language barrier, global weakness,  slow processing  ?  ?Mobility ? Overal bed mobility: Needs Assistance ?Bed Mobility: Rolling, Supine to Sit, Sit to Supine ?Supine to sit: Min guard, HOB elevated ?Sit to supine: Min guard ?General bed mobil

## 2022-03-19 DIAGNOSIS — I610 Nontraumatic intracerebral hemorrhage in hemisphere, subcortical: Secondary | ICD-10-CM | POA: Diagnosis not present

## 2022-03-19 LAB — COMPREHENSIVE METABOLIC PANEL
ALT: 41 U/L (ref 0–44)
AST: 34 U/L (ref 15–41)
Albumin: 3.3 g/dL — ABNORMAL LOW (ref 3.5–5.0)
Alkaline Phosphatase: 80 U/L (ref 38–126)
Anion gap: 9 (ref 5–15)
BUN: 12 mg/dL (ref 8–23)
CO2: 24 mmol/L (ref 22–32)
Calcium: 9.3 mg/dL (ref 8.9–10.3)
Chloride: 106 mmol/L (ref 98–111)
Creatinine, Ser: 0.84 mg/dL (ref 0.44–1.00)
GFR, Estimated: >60 mL/min (ref >60)
Glucose, Bld: 137 mg/dL — ABNORMAL HIGH (ref 70–99)
Potassium: 3.7 mmol/L (ref 3.5–5.1)
Sodium: 139 mmol/L (ref 135–145)
Total Bilirubin: 0.8 mg/dL (ref 0.3–1.2)
Total Protein: 7.4 g/dL (ref 6.5–8.1)

## 2022-03-19 LAB — CBC WITH DIFFERENTIAL/PLATELET
Abs Immature Granulocytes: 0.03 10*3/uL (ref 0.00–0.07)
Basophils Absolute: 0.1 10*3/uL (ref 0.0–0.1)
Basophils Relative: 1 %
Eosinophils Absolute: 0.2 10*3/uL (ref 0.0–0.5)
Eosinophils Relative: 3 %
HCT: 36.9 % (ref 36.0–46.0)
Hemoglobin: 12.2 g/dL (ref 12.0–15.0)
Immature Granulocytes: 1 %
Lymphocytes Relative: 43 %
Lymphs Abs: 2.8 10*3/uL (ref 0.7–4.0)
MCH: 31 pg (ref 26.0–34.0)
MCHC: 33.1 g/dL (ref 30.0–36.0)
MCV: 93.9 fL (ref 80.0–100.0)
Monocytes Absolute: 0.3 10*3/uL (ref 0.1–1.0)
Monocytes Relative: 5 %
Neutro Abs: 3.1 10*3/uL (ref 1.7–7.7)
Neutrophils Relative %: 47 %
Platelets: 352 10*3/uL (ref 150–400)
RBC: 3.93 MIL/uL (ref 3.87–5.11)
RDW: 11.8 % (ref 11.5–15.5)
WBC: 6.5 10*3/uL (ref 4.0–10.5)
nRBC: 0 % (ref 0.0–0.2)

## 2022-03-19 LAB — GLUCOSE, CAPILLARY
Glucose-Capillary: 126 mg/dL — ABNORMAL HIGH (ref 70–99)
Glucose-Capillary: 203 mg/dL — ABNORMAL HIGH (ref 70–99)
Glucose-Capillary: 127 mg/dL — ABNORMAL HIGH (ref 70–99)
Glucose-Capillary: 120 mg/dL — ABNORMAL HIGH (ref 70–99)

## 2022-03-19 NOTE — Progress Notes (Signed)
Patient ID: Paula Combs, female   DOB: 04/05/1961, 61 y.o.   MRN: [id number] ?Met with the patient and son to review rehab process, team conference and plan of care via [place] interpreter. Reviewed secondary risk management including DM (A1C 5.7), HTN, HLD (LDL153/Trig 275), medications and dietary modification recommendations. Handouts given to son for son in law to review with the family. Patient incontinent and inconsistent with communication of needs; toileting protocol initiated. Also gave instructions on setting up a :"My chart" account to the son for after discharge information and follow up appointment notifications. Son reported no concerns or questions at the present time. Continue to follow along to discharge to address educational needs to facilitate preparation for discharge home. Dorien Chihuahua B ? ?

## 2022-03-19 NOTE — Plan of Care (Signed)
?  Problem: RH Swallowing ?Goal: LTG Patient will consume least restrictive diet using compensatory strategies with assistance (SLP) ?Description: LTG:  Patient will consume least restrictive diet using compensatory strategies with assistance (SLP) ?Flowsheets (Taken 03/19/2022 1624) ?LTG: Pt Patient will consume least restrictive diet using compensatory strategies with assistance of (SLP): Minimal Assistance - Patient > 75% ?  ?Problem: RH Cognition - SLP ?Goal: RH LTG Patient will demonstrate orientation with cues ?Description:  LTG:  Patient will demonstrate orientation to person/place/time/situation with cues (SLP)   ?Flowsheets (Taken 03/19/2022 1624) ?LTG Patient will demonstrate orientation to: Situation ?LTG: Patient will demonstrate orientation using cueing (SLP): Minimal Assistance - Patient > 75% ?  ?Problem: RH Comprehension Communication ?Goal: LTG Patient will comprehend basic/complex auditory (SLP) ?Description: LTG: Patient will comprehend basic/complex auditory information with cues (SLP). ?Flowsheets (Taken 03/19/2022 1624) ?LTG: Patient will comprehend: Basic auditory information ?LTG: Patient will comprehend auditory information with cueing (SLP): Moderate Assistance - Patient 50 - 74% ?  ?Problem: RH Expression Communication ?Goal: LTG Patient will express needs/wants via multi-modal(SLP) ?Description: LTG:  Patient will express needs/wants via multi-modal communication (gestures/written, etc) with cues (SLP) ?Flowsheets (Taken 03/19/2022 1624) ?LTG: Patient will express needs/wants via multimodal communication (gestures/written, etc) with cueing (SLP): Moderate Assistance - Patient 50 - 74% ?Goal: LTG Patient will verbally express basic/complex needs(SLP) ?Description: LTG:  Patient will verbally express basic/complex needs, wants or ideas with cues  (SLP) ?Flowsheets (Taken 03/19/2022 1624) ?LTG: Patient will verbally express basic/complex needs, wants or ideas (SLP): Moderate Assistance - Patient 50 -  74% ?  ?Problem: RH Attention ?Goal: LTG Patient will demonstrate this level of attention during functional activites (SLP) ?Description: LTG:  Patient will will demonstrate this level of attention during functional activites (SLP) ?Flowsheets (Taken 03/19/2022 1624) ?Patient will demonstrate during cognitive/linguistic activities the attention type of: Sustained ?Patient will demonstrate this level of attention during cognitive/linguistic activities in: Controlled ?LTG: Patient will demonstrate this level of attention during cognitive/linguistic activities with assistance of (SLP): Minimal Assistance - Patient > 75% ?Number of minutes patient will demonstrate attention during cognitive/linguistic activities: 15 ?  ?Problem: RH Awareness ?Goal: LTG: Patient will demonstrate awareness during functional activites type of (SLP) ?Description: LTG: Patient will demonstrate awareness during functional activites type of (SLP) ?Flowsheets (Taken 03/19/2022 1624) ?Patient will demonstrate during cognitive/linguistic activities awareness type of: Intellectual ?LTG: Patient will demonstrate awareness during cognitive/linguistic activities with assistance of (SLP): Minimal Assistance - Patient > 75% ?  ?

## 2022-03-19 NOTE — Progress Notes (Signed)
Inpatient Rehabilitation Center ?Individual Statement of Services ? ?Patient Name:  Paula Combs  ?Date:  03/19/2022 ? ?Welcome to the [facility].  Our goal is to provide you with an individualized program based on your diagnosis and situation, designed to meet your specific needs.  With this comprehensive rehabilitation program, you will be expected to participate in at least 3 hours of rehabilitation therapies Monday-Friday, with modified therapy programming on the weekends. ? ?Your rehabilitation program will include the following services:  Physical Therapy (PT), Occupational Therapy (OT), Speech Therapy (ST), 24 hour per day rehabilitation nursing, Therapeutic Recreaction (TR), Neuropsychology, Care Coordinator, Rehabilitation Medicine, Nutrition Services, and Pharmacy Services ? ?Weekly team conferences will be held on Wednesdays to discuss your progress.  Your Inpatient Rehabilitation Care Coordinator will talk with you frequently to get your input and to update you on team discussions.  Team conferences with you and your family in attendance may also be held. ? ?Expected length of stay: 10-14 Days  Overall anticipated outcome:  Supervision to Min A ? ?Depending on your progress and recovery, your program may change. Your Inpatient Rehabilitation Care Coordinator will coordinate services and will keep you informed of any changes. Your Inpatient Rehabilitation Care Coordinator's name and contact numbers are listed  below. ? ?The following services may also be recommended but are not provided by the [facility]:  ? ?Home Health Rehabiltiation Services ?Outpatient Rehabilitation Services ? ?  ?Arrangements will be made to provide these services after discharge if needed.  Arrangements include referral to agencies that provide these services. ? ?Your insurance has been verified to be:   [place] Medicaid  ?Your primary doctor is:  [facility] ? ?Pertinent information  will be shared with your doctor and your insurance company. ? ?Inpatient Rehabilitation Care Coordinator:  Erlene Quan, [place] or ([phone] ? ?Information discussed with and copy given to patient by: Dyanne Iha, 03/19/2022, 1:26 PM    ?

## 2022-03-19 NOTE — Progress Notes (Signed)
Inpatient Rehabilitation Care Coordinator ?Assessment and Plan ?Patient Details  ?Name: Paula Combs ?MRN: [id number] ?Date of Birth: 04/01/1961 ? ?Today's Date: 03/19/2022 ? ?Hospital Problems: Principal Problem: ?  ICH (intracerebral hemorrhage) ([place]) ? ?Past Medical History:  ?Past Medical History:  ?Diagnosis Date  ? Controlled type 2 diabetes mellitus without complication, without long-term current use of insulin ([place]) 03/17/2022  ? HTN (hypertension) 03/17/2022  ? Hyperlipidemia 03/17/2022  ? ?Past Surgical History: History reviewed. No pertinent surgical history. ?Social History:  reports that she has never smoked. She has never used smokeless tobacco. No history on file for alcohol use and drug use. ? ?Family / Support Systems ?Anticipated Caregiver: SIL and daughter ?Ability/Limitations of Caregiver: daughter stays at home ?Caregiver Availability: 24/7 ?Family Dynamics: support form SIL (English speaking), daughter and grandchildren ? ?Social History ?Preferred language: [place] Glad ?Religion:  ?Health Literacy - How often do you need to have someone help you when you read instructions, pamphlets, or other written material from your doctor or pharmacy?: Patient unable to respond  ? ?Abuse/Neglect ?Abuse/Neglect Assessment Can Be Completed: Unable to assess, patient is non-responsive or altered mental status ?Physical Abuse: Denies ?Verbal Abuse: Denies ?Sexual Abuse: Denies ?Exploitation of patient/patient's resources: Denies ?Self-Neglect: Denies ? ?Patient response to: ?Social Isolation - How often do you feel lonely or isolated from those around you?: Patient unable to respond ? ?Emotional Status ?  ? ?Patient / Family Perceptions, Expectations & Goals ?Pt/Family understanding of illness & functional limitations: yes ?Premorbid pt/family roles/activities: previously independent W/O AD ?Anticipated changes in roles/activities/participation: Son in Sports coach and daughter able to provide assistance at home ?Pt/family  expectations/goals: Supervision to Min A ? ?Community Resources ?Community Agencies: None ?Premorbid Home Care/DME Agencies: None ?Transportation available at discharge: Family able to transport ?Is the patient able to respond to transportation needs?: No ? ?Discharge Planning ?Living Arrangements: Children, Other (Comment) (Son in Sports coach and 3 granddaughters) ?Support Systems: Children ?Type of Residence: Private residence ?Insurance Resources: [employer] (specify county) ?Financial Resources: Family Support ?Financial Screen Referred: No ?Living Expenses: Lives with family ?Money Management: Family ?Does the patient have any problems obtaining your medications?: No ?Home Management: previously independent ?Patient/Family Preliminary Plans: Son in law, children and granddaughters able to assist ?Care Coordinator Barriers to Discharge: Insurance for SNF coverage, Other (comments) ?Care Coordinator Anticipated Follow Up Needs: HH/OP ?DC Planning Additional Notes/Comments: Language barrier- Language Line [phone] ?Expected length of stay: 10-14 Days ? ?Clinical Impression ?SW made attempt to contact patient family. Sw will continue to follow up with patient son in law.  ? ?Dyanne Iha ?03/19/2022, 1:20 PM ? ?  ?

## 2022-03-19 NOTE — Progress Notes (Addendum)
?                                                       PROGRESS NOTE ? ? ?Subjective/Complaints: ?Pt eating breakfast mod I, no cough with swallowing  ? ?ROS- could not obtain  ?Objective: ?  ?No results found. ?Recent Labs  ?  03/18/22 ?0305 03/19/22 ?1700  ?WBC 7.5 6.5  ?HGB 11.4* 12.2  ?HCT 34.3* 36.9  ?PLT 358 352  ? ?Recent Labs  ?  03/18/22 ?0305 03/19/22 ?1749  ?NA 140 139  ?K 3.5 3.7  ?CL 111 106  ?CO2 21* 24  ?GLUCOSE 103* 137*  ?BUN 18 12  ?CREATININE 0.84 0.84  ?CALCIUM 9.0 9.3  ? ? ?Intake/Output Summary (Last 24 hours) at 03/19/2022 0823 ?Last data filed at 03/18/2022 4496 ?Gross per 24 hour  ?Intake 240 ml  ?Output --  ?Net 240 ml  ?  ? ?  ? ?Physical Exam: ?Vital Signs ?Blood pressure (!) 158/101, pulse 85, temperature 98.4 ?F (36.9 ?C), temperature source Oral, resp. rate 18, height 4\' 11"  (1.499 m), weight 62.2 kg, SpO2 94 %. ? ? ?General: No acute distress ?Mood and affect are appropriate ?Heart: Regular rate and rhythm no rubs murmurs or extra sounds ?Lungs: Clear to auscultation, breathing unlabored, no rales or wheezes ?Abdomen: Positive bowel sounds, soft nontender to palpation, nondistended ?Extremities: No clubbing, cyanosis, or edema ?Skin: No evidence of breakdown, no evidence of rash ?Neurologic: Cranial nerves II through XII intact, motor strength is 4/5 in bilateral deltoid, bicep, tricep, grip, hip flexor, knee extensors, ankle dorsiflexor and plantar flexor ? ?Musculoskeletal: Full range of motion in all 4 extremities. No joint swelling ? ? ?Assessment/Plan: ?1. Functional deficits which require 3+ hours per day of interdisciplinary therapy in a comprehensive inpatient rehab setting. ?Physiatrist is providing close team supervision and 24 hour management of active medical problems listed below. ?Physiatrist and rehab team continue to assess barriers to discharge/monitor patient progress toward functional and medical goals ? ?Care Tool: ? ?Bathing ?   ?   ?   ?  ?  ?Bathing assist   ?  ?   ?Upper Body Dressing/Undressing ?Upper body dressing   ?  ?   ?Upper body assist   ?   ?Lower Body Dressing/Undressing ?Lower body dressing ? ? ?   ?  ? ?  ? ?Lower body assist   ?   ? ?Toileting ?Toileting    ?Toileting assist   ?  ?  ?Transfers ?Chair/bed transfer ? ?Transfers assist ?   ? ?Chair/bed transfer assist level: Moderate Assistance - Patient 50 - 74% ?  ?  ?Locomotion ?Ambulation ? ? ?Ambulation assist ? ?   ? ?  ?  ?   ? ?Walk 10 feet activity ? ? ?Assist ?   ? ?  ?   ? ?Walk 50 feet activity ? ? ?Assist   ? ?  ?   ? ? ?Walk 150 feet activity ? ? ?Assist   ? ?  ?  ?  ? ?Walk 10 feet on uneven surface  ?activity ? ? ?Assist   ? ? ?  ?   ? ?Wheelchair ? ? ? ? ?Assist   ?  ?  ? ?  ?   ? ? ?Wheelchair 50 feet with 2 turns activity ? ? ? ?  Assist ? ?  ?  ? ? ?   ? ?Wheelchair 150 feet activity  ? ? ? ?Assist ?   ? ? ?   ? ?Blood pressure (!) 158/101, pulse 85, temperature 98.4 ?F (36.9 ?C), temperature source Oral, resp. rate 18, height 4\' 11"  (1.499 m), weight 62.2 kg, SpO2 94 %. ? ?Medical Problem List and Plan: ?1. 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            -patient may shower ?            -ELOS/Goals: 10-14 days S ?          PT, OT evals today  ?2.  Antithrombotics: ?-DVT/anticoagulation:  Mechanical:  Antiembolism stockings, knee (TED hose) Bilateral lower extremities ?            -antiplatelet therapy: none ?3. Pain Management: Tylenol as needed ?4. Mood: LCSW to evaluate and provide emotional support ?            -antipsychotic agents: n/a ?5. Neuropsych: This patient is not capable of making decisions on her own behalf. ?6. Skin/Wound Care: Routine skin care checks ?7. Dysphagia: provide assistance with set up of meals ?            --dysphagia 3 diet, thin liquids ?8: Right eye blindness: chronic ?9: DM2: Continue CBGs QID and SSI>>controlled ?            --on Jardiance and metformin at home ?            -restart metformin ?CBG (last 3)  ?Recent Labs  ?   03/18/22 ?1535 03/18/22 ?2137 03/19/22 ?05/19/22  ?GLUCAP 122* 148* 126*  ? ?Controlled 5/4 ?10: Hypertension: closely monitor with goal sys BP < 160 ?Vitals:  ? 03/18/22 2022 03/19/22 0512  ?BP: (!) 166/99 (!) 158/101  ?Pulse: 77 85  ?Resp: 17 18  ?Temp: 98.9 ?F (37.2 ?C) 98.4 ?F (36.9 ?C)  ?SpO2: 96% 94%  ? ? ?11: Hyperlipidemia: statin on hold due to IPH ?            --home atorvastatin 40 mg (neuro rec: increasing dose at discharge) ?12: Primary open-angle glaucoma: continue Timoptic ?13: Overweight: BMI 27.7; dietary counseling  ?  ? ?LOS: ?1 days ?A FACE TO FACE EVALUATION WAS PERFORMED ? ?05/19/22 Paula Combs ?03/19/2022, 8:23 AM  ? ? ? ?

## 2022-03-19 NOTE — Evaluation (Signed)
Physical Therapy Assessment and Plan ? ?Patient Details  ?Name: Paula Combs ?MRN: [id number] ?Date of Birth: Aug 26, 1961 ? ?PT Diagnosis: Abnormality of gait, Cognitive deficits, Difficulty walking, Hemiparesis dominant, Impaired cognition, and Muscle weakness ?Rehab Potential: Good ?ELOS: 10-14 days  ? ?Today's Date: 03/19/2022 ?PT Individual Time: 9622-2979 ?PT Individual Time Calculation (min): 73 min   ? ?Hospital Problem: Principal Problem: ?  ICH (intracerebral hemorrhage) ([facility]) ? ? ?Past Medical History:  ?Past Medical History:  ?Diagnosis Date  ? Controlled type 2 diabetes mellitus without complication, without long-term current use of insulin ([facility]) 03/17/2022  ? HTN (hypertension) 03/17/2022  ? Hyperlipidemia 03/17/2022  ? ?Past Surgical History: History reviewed. No pertinent surgical history. ? ?Assessment & Plan ?Clinical Impression: Patient is a 61 y.o. year old female who awoke with confusion, slurred speech and right facial droop on 03/14/2022. She presented to [facility] by way of family transportation. Patient speaks a dialect of [name] and her daughter and son-in-law provide assistance with interpretation. She was outside the window for tenecteplase.  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 Neurology, Dr. Selina Cooley, consulted. Admitted to ICU on clevidipine, followed by Dr. Roda Shutters. Unable to obtain MRI due to presence of aneurysm coil. Right leg weaker than left on neurology initial assessment as well as non-verbal. Home meds restarted 5/1. MBS performed now on dysphagia 3 thin liquid diet. Transferred out of ICU on 5/2 to hospitalist service. SSI continues. ?  ?Family relates previous stroke in either 2004 or 2005 with difficulty ambulating which improved over time. Right leg weaker than left on neurology initial assessment as well as non-verbal. 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 On ARB for hypertension. Previous anterior communicating artery aneurysm s/p coil  and therefore unable to obtain MRI. Patient transferred to CIR on 03/18/2022 .  ? ?Patient currently requires min assist with mobility secondary to muscle weakness, decreased cardiorespiratoy endurance, impaired timing and sequencing, unbalanced muscle activation, decreased coordination, and decreased motor planning,  , decreased attention to right, decreased attention, decreased awareness, decreased problem solving, decreased safety awareness, decreased memory, and delayed processing, and decreased sitting balance, decreased standing balance, decreased postural control, and decreased balance strategies.  Prior to hospitalization, patient was independent  with mobility and lived with Daughter, Other (Comment) (and son in law and three kids (83, 59, and 32 month old)) in a House home.  Home access is 3Stairs to enter. ? ?Patient will benefit from skilled PT intervention to maximize safe functional mobility, minimize fall risk, and decrease caregiver burden for planned discharge home with 24 hour supervision.  Anticipate patient will benefit from follow up OP at discharge. ? ?PT - End of Session ?Activity Tolerance: Tolerates 30+ min activity with multiple rests ?Endurance Deficit: Yes ?Endurance Deficit Description: mild endurance deficit; benefits from periodic seated rest breaks during functional mobility ?PT Assessment ?Rehab Potential (ACUTE/IP ONLY): Good ?PT Barriers to Discharge: Home environment access/layout;Incontinence ?PT Patient demonstrates impairments in the following area(s): Balance;Perception;Behavior;Safety;Edema;Sensory;Endurance;Skin Integrity;Motor;Nutrition;Pain ?PT Transfers Functional Problem(s): Bed Mobility;Bed to Chair;Car;Furniture;Floor ?PT Locomotion Functional Problem(s): Ambulation;Stairs ?PT Plan ?PT Intensity: Minimum of 1-2 x/day ,45 to 90 minutes ?PT Frequency: 5 out of 7 days ?PT Duration Estimated Length of Stay: 10-14 days ?PT Treatment/Interventions: Ambulation/gait  training;Cognitive remediation/compensation;Discharge planning;DME/adaptive equipment instruction;Functional mobility training;Pain management;Psychosocial support;Splinting/orthotics;Therapeutic Activities;UE/LE Strength taining/ROM;Visual/perceptual remediation/compensation;Balance/vestibular training;Community reintegration;Disease management/prevention;Neuromuscular re-education;Functional electrical stimulation;Patient/family education;Skin care/wound Engineer, manufacturing systems;Therapeutic Exercise;UE/LE Coordination activities ?PT Transfers Anticipated Outcome(s): supervision using LRAD ?PT Locomotion Anticipated Outcome(s): supervision using LRAD ?PT  Recommendation ?Follow Up Recommendations: Outpatient PT;24 hour supervision/assistance;Home health PT (HH vs OP) ?Patient destination: Home ?Equipment Recommended: To be determined ? ? ?PT Evaluation ?Precautions/Restrictions ?Precautions ?Precautions: Fall;Other (comment) ?Precaution Comments: mild R hemiparesis, possible R inattention ?Restrictions ?Weight Bearing Restrictions: No ?Pain ?Pain Assessment ?Pain Scale: 0-10 ?Pain Score: 0-No pain ?Faces Pain Scale: No hurt ?Pain Interference ?Pain Interference ?Pain Effect on Sleep: 8. 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 Unable to answer ?Pain Interference with Day-to-Day Activities: 8. Unable to answer ?Home Living/Prior Functioning ?Home Living ?Available Help at Discharge: Family;Available 24 hours/day (per chart review) ?Type of Home: House ?Home Access: Stairs to enter ?Entrance Stairs-Number of Steps: 3 ?Entrance Stairs-Rails: Left;Right ?Home Layout: One level ?Bathroom Shower/Tub: Tub/shower unit ?Additional Comments: home environment obtain from chart review due to pt's aphasia ? Lives With: Daughter;Other (Comment) (and son in law and three kids (70, 74, and 63 month old)) ?Prior Function ?Level of Independence:  Independent with transfers;Needs assistance with homemaking;Independent with gait ? Able to Take Stairs?: Yes ?Driving: No ?Vision/Perception  ?Vision - History ?Ability to See in Adequate Light: 1 Impaired ?Pt visual report:  difficult to assess due to global aphasia (expressive more impaired) with pt having slurred speech - responds with yes/no head nods --- pt reports blurring of vision ?Vision - Assessment ?Additional Comments: able to visually track in all quadrants of gaze, some visual and tactile cueing needed to attend to right side of sink ?Perception ?Perception: Impaired ?Inattention/Neglect: Impaired-to be further tested in functional context ?Praxis ?Praxis: Impaired ?Praxis Impairment Details: Motor planning ?Cognition ?Overall Cognitive Status: Impaired/Different from baseline ?Arousal/Alertness: Awake/alert ?Orientation Level: Oriented to place;Oriented to person;Disoriented to time;Disoriented to situation (difficult to understand due to aphsia with slurred speech per interpreter) ?Attention: Focused;Sustained ?Focused Attention: Appears intact ?Sustained Attention: Impaired ?Awareness: Impaired (difficult to assess due to aphasia) ?Problem Solving: Impaired ?Sequencing: Impaired ?Safety/Judgment: Impaired (no overt impulsiveness noted; need to further assess in functional context) ?Sensation  ?Sensation ?Light Touch: Appears Intact (difficult to assess due to aphasia but visually looks when therapist randomly touches her on R side during session) ?Hot/Cold: Not tested ?Proprioception: Appears Intact (will need to continue assessing) ?Stereognosis: Not tested ?Coordination ?Gross Motor Movements are Fluid and Coordinated: No ?Coordination and Movement Description: impaired due to mild R hemiparesis ?Motor  ?Motor ?Motor: Motor apraxia;Other (comment) ?Motor - Skilled Clinical Observations: mild R hemiparesis  ? ?Trunk/Postural Assessment  ?Cervical Assessment ?Cervical Assessment: Within Functional  Limits ?Thoracic Assessment ?Thoracic Assessment: Within Functional Limits ?Lumbar Assessment ?Lumbar Assessment: Exceptions to [facility] (mild posterior pelvic tilt) ?Postural Control ?Postural Control: Deficits on evaluation ?

## 2022-03-19 NOTE — Evaluation (Signed)
Speech Language Pathology Assessment and Plan ? ?Patient Details  ?Name: Paula Combs ?MRN: [id number] ?Date of Birth: August 19, 1961 ? ?SLP Diagnosis: Aphasia;Apraxia;Dysarthria;Dysphagia  ?Rehab Potential: Good ?ELOS: 10-14 days  ? ? ?Today's Date: 03/19/2022 ?SLP Individual Time: 1300-1400 ?SLP Individual Time Calculation (min): 60 min ? ? ?Hospital Problem: Principal Problem: ?  ICH (intracerebral hemorrhage) (HCC) ? ?Past Medical History:  ?Past Medical History:  ?Diagnosis Date  ? Controlled type 2 diabetes mellitus without complication, without long-term current use of insulin (HCC) 03/17/2022  ? HTN (hypertension) 03/17/2022  ? Hyperlipidemia 03/17/2022  ? ?Past Surgical History: History reviewed. No pertinent surgical history. ? ?Assessment / Plan / Recommendation ?Clinical Impression HPI: Paula Combs is a 61 year old female who awoke with confusion, slurred speech and right facial droop on 03/14/2022. She presented to [facility] by way of family transportation. Patient speaks a dialect of [name] and her daughter and son-in-law provide assistance with interpretation. She was outside the window for tenecteplase.  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 Neurology, Dr. Selina Cooley, consulted. Admitted to ICU on clevidipine, followed by Dr. Roda Shutters. Unable to obtain MRI due to presence of aneurysm coil. Right leg weaker than left on neurology initial assessment as well as non-verbal. Home meds restarted 5/1. MBS performed now on dysphagia 3 thin liquid diet. Transferred out of ICU on 5/2 to hospitalist service. SSI continues. ?  ?Family relates previous stroke in either 2004 or 2005 with difficulty ambulating which improved over time. Right leg weaker than left on neurology initial assessment as well as non-verbal. 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 On ARB for hypertension. Previous anterior communicating artery aneurysm s/p coil and therefore unable to obtain MRI.  Patient  transferred to CIR on 03/18/2022. ? ?SLP consulted for CSE, motor speech, and cognitive-linguistic evaluation s/p presents with L basal ganglion IPH. Of note, [name] interpreter present for the duration of today's assessment (pt's pastor)  ? ?Per informal assessment measures, pt presents with at least mild-moderate receptive and severe expressive communication deficits. Cognitive deficits are apparent in the areas of sustained attention and intellectual awareness; however, unable to fully assess cognition in the setting of current communication deficits. Motor speech deficits also present with interpreting stating that pt would often demonstrate verbal perseveration, verbalize words inconsistently, and part-word (initial phoneme) dysfluencies. Pt benefited from yes/no question and binary choice prompts to communicate wants and needs. Prior to admission, pt reports she was living with her daughter and preparing meals, handling some money, and her medications. Did not drive, read, or write. Pt unable to confirm deficits in light of poor insight; however, pt's son reports changes in communication and cognition from baseline.  ? ?Re: deglutition, pt presents with mild oral dysphagia c/b prolonged mastication + oral transit; nevertheless functional. No evidence of pharyngeal dysphagia observed across thin liquid and soft solid consistencies; vocal quality remained clear and dry. Pt was noted to be mildly impulsive with intake and required Min to Mod A for implementation of aspiration precautions (slow rate, small + single bites/sips, and alternating solids/liquids). Education provided re: recommendation for full supervision with meals; pt's son and RN verbalized understanding. ? ?Given assessment findings, pt would benefit from skilled ST interventions targeting aforementioned deficits, in order to maximize pt's independence and decreased caregiver burden. Results and recommendations were reviewed with pt and pt's son who  verbalized understanding. Please see below for additional findings and short-term goals. ?  ?Skilled Therapeutic Interventions  Informal communication evaluation and Bedside Swallow Evaluation completed.  ?SLP Assessment ? Patient will need skilled Speech Lanaguage Pathology Services during CIR admission  ?  ?Recommendations ? SLP Diet Recommendations: Dysphagia 3 (Mech soft);Thin ?Liquid Administration via: Cup;Straw ?Medication Administration: Whole meds with liquid ?Supervision: Full supervision/cueing for compensatory strategies;Trained caregiver to feed patient ?Compensations: Slow rate;Small sips/bites;Minimize environmental distractions;Follow solids with liquid ?Postural Changes and/or Swallow Maneuvers: Seated upright 90 degrees;Upright 30-60 min after meal ?Oral Care Recommendations: Oral care BID ?Patient destination: Home ?Follow up Recommendations: Home Health SLP;Outpatient SLP;24 hour supervision/assistance ?Equipment Recommended: None recommended by SLP  ?  ?SLP Frequency 3 to 5 out of 7 days   ?SLP Duration ? ?SLP Intensity ? ?SLP Treatment/Interventions 10-14 days ? ?Minumum of 1-2 x/day, 30 to 90 minutes ? ?Cueing hierarchy;Dysphagia/aspiration precaution training;Environmental controls;Functional tasks;Patient/family education;Speech/Language facilitation;Therapeutic Activities   ? ?Pain ?Pain Assessment ?Pain Scale: 0-10 ?Pain Score: 0-No pain ?Faces Pain Scale: No hurt ? ?Prior Functioning ?Cognitive/Linguistic Baseline: Within functional limits ?Type of Home: House ? Lives With: Daughter;Other (Comment) ?Available Help at Discharge: Family;Available 24 hours/day ?Education: None, per family - cannot ready nor write ?Vocation: Unemployed ? ?SLP Evaluation ?Cognition ?Overall Cognitive Status: Difficult to assess ?Arousal/Alertness: Awake/alert ?Orientation Level: Oriented to place;Oriented to person ?Attention: Focused;Sustained ?Focused Attention: Appears intact ?Sustained Attention:  Impaired ?Sustained Attention Impairment: Functional basic;Verbal basic ?Memory: Impaired ?Memory Impairment: Retrieval deficit;Decreased recall of new information;Decreased short term memory ?Awareness: Impaired (Pt unaware of situation and why she was in the hospital) ?Awareness Impairment: Intellectual impairment ?Problem Solving: Impaired ?Problem Solving Impairment: Functional basic ?Sequencing: Impaired ?Behaviors: Perseveration ?Safety/Judgment: Impaired (Pt unable to follow aspiration precautions of alternating solids/liquids and small bites/sips)  ? ?Comprehension ?Auditory Comprehension ?Overall Auditory Comprehension: Impaired ?Yes/No Questions: Impaired ?Basic Biographical Questions: 76-100% accurate (100%) ?Basic Immediate Environment Questions: 50-74% accurate (60%) ?Complex Questions: Not tested ?Commands: Impaired ?One Step Basic Commands: 75-100% accurate (75%) ?Conversation: Simple ?Interfering Components: Motor planning ?EffectiveTechniques: Extra processing time;Pausing;Repetition;Visual/Gestural cues ?Visual Recognition/Discrimination ?Discrimination: Not tested ?Reading Comprehension ?Reading Status: Not tested (Pt unable to read at baseline, per family) ? ?Expression ?Expression ?Primary Mode of Expression: Verbal ?Verbal Expression ?Overall Verbal Expression: Impaired ?Initiation: Impaired ?Automatic Speech: Counting (Only able to count to 3, when asked to count to 10. Does not know days of the week or months of the year) ?Level of Generative/Spontaneous Verbalization: Word ?Repetition: Impaired ?Level of Impairment: Word level ?Naming: Impairment ?Responsive: Not tested ?Confrontation:  (Needs further assessment, 100% for basic objects in room) ?Convergent: Not tested ?Divergent: 0-24% accurate (0%) ?Verbal Errors: Perseveration;Phonemic paraphasias ?Pragmatics: Impairment ?Impairments: Eye contact (Decreased eye contact per family) ?Interfering Components: Speech  intelligibility;Attention ?Effective Techniques: Sentence completion;Semantic cues ?Written Expression ?Dominant Hand: Right ?Written Expression: Not tested (Pt did not write at baseline) ? ?Oral Motor ?Oral Motor/Sensory Function ?Overal

## 2022-03-19 NOTE — Progress Notes (Signed)
Inpatient Rehabilitation Admission Medication Review by a Pharmacist ? ?A complete drug regimen review was completed for this patient to identify any potential clinically significant medication issues. ? ?High Risk Drug Classes Is patient taking? Indication by Medication  ?Antipsychotic Yes Complazine prn - nausea/vomiting  ?Anticoagulant No   ?Antibiotic No   ?Opioid No   ?Antiplatelet No   ?Hypoglycemics/insulin Yes SSI, Metformin - DM  ?Vasoactive Medication No   ?Chemotherapy No   ?Other No Irbesartan - HTN ?Pantoprazole (substitute for PTA Omeprazole) - GERD ?Timolol ophthalmic - glaucoma  ? ? ? ?Type of Medication Issue Identified Description of Issue Recommendation(s)  ?Drug Interaction(s) (clinically significant) ?    ?Duplicate Therapy ?    ?Allergy ?    ?No Medication Administration End Date ?    ?Incorrect Dose ?    ?Additional Drug Therapy Needed ?    ?Significant med changes from prior encounter (inform family/care partners about these prior to discharge). Holding Atorvastatin 40 mg daily due to ICH. ?Off Januvia, on less Metformin. LDL 153, Neuro recommends increasing to 80 mg daily at discharge. ?SSI and lower dose Metformin for now, monitor CBGs.   ?Other ? Off weekly Fosamax while in the hospital.   ? ? ?Clinically significant medication issues were identified that warrant physician communication and completion of prescribed/recommended actions by midnight of the next day:  No ? ?Time spent performing this drug regimen review (minutes):  20 ? ? ?Dennie Fetters, RPh ?03/19/2022 11:26 AM ?

## 2022-03-19 NOTE — Evaluation (Signed)
Occupational Therapy Assessment and Plan ? ?Patient Details  ?Name: Paula Combs ?MRN: [id number] ?Date of Birth: [birth year] ? ?OT Diagnosis: apraxia, cognitive deficits, disturbance of vision, and muscle weakness (generalized) ?Rehab Potential: Rehab Potential (ACUTE ONLY): Good ?ELOS: 10-14 days  ? ?Today's Date: 03/19/2022 ?OT Individual Time: 0350-0938 ?OT Individual Time Calculation (min): 75 min    ? ?Hospital Problem: Principal Problem: ?  ICH (intracerebral hemorrhage) (HCC) ? ? ?Past Medical History:  ?Past Medical History:  ?Diagnosis Date  ? Controlled type 2 diabetes mellitus without complication, without long-term current use of insulin (HCC) 03/17/2022  ? HTN (hypertension) 03/17/2022  ? Hyperlipidemia 03/17/2022  ? ?Past Surgical History: History reviewed. No pertinent surgical history. ? ?Assessment & Plan ?Clinical Impression: Paula Combs is a 61 year old female who awoke with confusion, slurred speech and right facial droop on 03/14/2022. She presented to [facility] by way of family transportation. Patient speaks a dialect of [name] and her daughter and son-in-law provide assistance with interpretation. She was outside the window for tenecteplase.  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 Neurology, Dr. Selina Cooley, consulted. Admitted to ICU on clevidipine, followed by Dr. Roda Shutters. Unable to obtain MRI due to presence of aneurysm coil. Right leg weaker than left on neurology initial assessment as well as non-verbal. Home meds restarted 5/1. MBS performed now on dysphagia 3 thin liquid diet. Transferred out of ICU on 5/2 to hospitalist service. SSI continues. ?  ?Family relates previous stroke in either 2004 or 2005 with difficulty ambulating which improved over time. Right leg weaker than left on neurology initial assessment as well as non-verbal. 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 On ARB for hypertension. Previous anterior communicating artery aneurysm s/p coil and  therefore unable to obtain MRI.  Patient transferred to CIR on 03/18/2022 .   ? ?Patient currently requires min with basic self-care skills secondary to muscle weakness, decreased cardiorespiratoy endurance, motor apraxia, decreased coordination, and decreased motor planning, decreased visual acuity, decreased visual perceptual skills, and field cut, decreased attention to right, right side neglect, decreased motor planning, and ideational apraxia, decreased attention, decreased awareness, decreased problem solving, decreased safety awareness, decreased memory, and delayed processing, and decreased sitting balance, decreased standing balance, decreased postural control, and decreased balance strategies.  Prior to hospitalization, patient could complete ADLs with supervision. ? ?Patient will benefit from skilled intervention to increase independence with basic self-care skills prior to discharge home with care partner.  Anticipate patient will require 24 hour supervision and follow up home health. ? ?OT - End of Session ?Activity Tolerance: Tolerates 30+ min activity with multiple rests ?Endurance Deficit: Yes ?Endurance Deficit Description: mild endurance deficit; benefits from periodic seated rest breaks during standing level self care. ?OT Assessment ?Rehab Potential (ACUTE ONLY): Good ?OT Barriers to Discharge: Decreased caregiver support ?OT Barriers to Discharge Comments: per son in law; he works full time and daughter takes care of 3 small children during the day but will be home ?OT Patient demonstrates impairments in the following area(s): Balance;Cognition;Endurance;Motor;Perception;Safety;Sensory;Skin Integrity;Vision ?OT Basic ADL's Functional Problem(s): Grooming;Bathing;Dressing;Toileting ?OT Transfers Functional Problem(s): Toilet;Tub/Shower ?OT Additional Impairment(s): Fuctional Use of Upper Extremity ?OT Plan ?OT Intensity: Minimum of 1-2 x/day, 45 to 90 minutes ?OT Frequency: 5 out of 7 days ?OT  Duration/Estimated Length of Stay: 10-14 days ?OT Treatment/Interventions: Charity fundraiser education;Therapeutic Activities;Wheelchair propulsion/positioning;Therapeutic Exercise;Psychosocial support;Functional electrical stimulation;Cognitive remediation/compensation;Community reintegration;Self Care/advanced ADL retraining;Functional mobility training;UE/LE Strength taining/ROM;UE/LE Coordination activities;Skin care/wound managment;Discharge planning;Neuromuscular  re-education;Disease mangement/prevention;Pain management;Splinting/orthotics;Visual/perceptual remediation/compensation ?OT Basic Self-Care Anticipated Outcome(s): supervision ?OT Toileting Anticipated Outcome(s): supervision ?OT Bathroom Transfers Anticipated Outcome(s): supervision ?OT Recommendation ?Patient destination: Home ?Follow Up Recommendations: Home health OT ?Equipment Recommended: To be determined ? ? ?OT Evaluation ?Precautions/Restrictions  ?Precautions ?Precautions: Fall ?Precaution Comments: mild right inattention/neglect, fall ?Restrictions ?Weight Bearing Restrictions: No ?General ?Chart Reviewed: Yes ?Pain ?Pain Assessment ?Pain Scale: Faces ?Pain Score: 0-No pain ?Faces Pain Scale: No hurt ?Home Living/Prior Functioning ?Home Living ?Family/patient expects to be discharged to:: Private residence ?Living Arrangements: Children, Other relatives (daughter and son in law) ?Available Help at Discharge: Family, Available 24 hours/day (son in law works full time; daughter home with kids) ?Type of Home: House ?Home Access: Stairs to enter ?Entrance Stairs-Number of Steps: 3 ?Entrance Stairs-Rails: Left, Right ?Home Layout: One level (per son in law) ?Bathroom Shower/Tub: Tub/shower unit ? Lives With: Daughter, Other (Comment) (and son in law and three kids (20, 58, and 40 month old)) ?Prior Function ?Level of Independence: Independent with transfers, Needs assistance with ADLs,  Needs assistance with homemaking, Independent with gait (son in law reports she was mostly independent but sometimes needed a little help with toileting) ?Driving: No ?Vision ?Baseline Vision/History: 2 Legally blind (right eye secondary to glaucome per chart review) ?Ability to See in Adequate Light: 1 Impaired ?Patient Visual Report: Other (comment) (difficult to assess due to pt nonverbal) ?Vision Assessment?: Vision impaired- to be further tested in functional context ?Additional Comments: able to visually track in all quadrants of gaze, some visual and tactile cueing needed to attend to right side of sink ?Perception  ?Perception: Impaired ?Inattention/Neglect: Does not attend to right visual field;Does not attend to right side of body (needs to be assessed further in functional context) ?Praxis ?Praxis: Impaired ?Praxis Impairment Details: Ideomotor;Motor planning;Perseveration ?Praxis-Other Comments: perseverating minimally while brushing teeth; unable to copy therapist visual demonstration of finger<>nose test. ?Cognition ?Cognition ?Overall Cognitive Status: Impaired/Different from baseline ?Arousal/Alertness: Awake/alert ?Orientation Level: Nonverbal/unable to assess ?Attention: Focused;Sustained ?Focused Attention: Appears intact ?Sustained Attention: Impaired ?Sustained Attention Impairment: Functional basic ?Awareness: Impaired (difficult to assess due to pt nonverbal) ?Problem Solving: Impaired ?Problem Solving Impairment: Functional basic ?Executive Function: Sequencing ?Sequencing: Impaired ?Sequencing Impairment: Functional basic ?Safety/Judgment: Impaired (no overt impulsiveness noted; need to further assess in functional context) ?Brief Interview for Mental Status (BIMS) ?Repetition of Three Words (First Attempt): No answer ?Temporal Orientation: Year: No answer (nonverbal) ?Temporal Orientation: Month: No answer (nonverbal) ?Temporal Orientation: Day: No answer (nonverbal) ?Recall: "Sock": No  answer (nonverbal) ?Recall: "Blue": No answer (nonverbal) ?Recall: "Bed": No answer (nonverbal) ?BIMS Summary Score: 99 ?Sensation ?Sensation ?Light Touch: Impaired by gross assessment (dropping items from ri

## 2022-03-19 NOTE — Discharge Summary (Signed)
Physician Discharge Summary  ?Patient ID: ?Paula Combs ?MRN: [id number] ?DOB/AGE: [birth year] 63 y.o. ? ?Admit date: 03/18/2022 ?Discharge date: 03/28/2022 ? ?Discharge Diagnoses:  ?Principal Problem: ?  ICH (intracerebral hemorrhage) ([place]) ?Active Problems: ?  Type 2 diabetes mellitus without complication, without long-term current use of insulin ([place]) ? ? ?Discharged Condition: stable ? ?Significant Diagnostic Studies: ?CT ANGIO HEAD NECK W WO CM ? ?Result Date: 03/14/2022 ?CLINICAL DATA:  Dizziness, persistent/recurrent, cardiac or vascular cause suspected Stroke/TIA, determine embolic source EXAM: CT ANGIOGRAPHY HEAD AND NECK TECHNIQUE: Multidetector CT imaging of the head and neck was performed using the standard protocol during bolus administration of intravenous contrast. Multiplanar CT image reconstructions and MIPs were obtained to evaluate the vascular anatomy. Carotid stenosis measurements (when applicable) are obtained utilizing NASCET criteria, using the distal internal carotid diameter as the denominator. RADIATION DOSE REDUCTION: This exam was performed according to the departmental dose-optimization program which includes automated exposure control, adjustment of the Sheri and/or kV according to patient size and/or use of iterative reconstruction technique. CONTRAST:  149mL OMNIPAQUE IOHEXOL 350 MG/ML SOLN COMPARISON:  None. FINDINGS: CT HEAD Brain: Acute parenchymal hemorrhage centered within the left lentiform nucleus measuring about 2.1 x 1.5 x 2.1 cm. There is some ill-defined hemorrhage tracking superiorly toward the caudate. Mild surrounding edema. No significant mass effect. No evidence of intraventricular extension. Prominence of ventricles and sulci reflects parenchymal volume loss. Patchy and confluent hypoattenuation in the supratentorial white matter is nonspecific but probably reflects moderate chronic microvascular ischemic changes. Vascular: Streak artifact form coil embolization in the anterior  communicating artery region. Skull: Unremarkable. Sinuses/Orbits: Partially imaged extensive paranasal sinus opacification with chronic maxillary mucoperiosteal thickening. Unremarkable orbits. Other: Bilateral patchy mastoid and middle ear opacification. Review of the MIP images confirms the above findings CTA NECK Aortic arch: Trace calcified plaque. Great vessel origins are patent Right carotid system: Patent.  No stenosis. Left carotid system: Patent.  No stenosis. Vertebral arteries: Patent and codominant.  No stenosis. Skeleton: Degenerative changes at C5-C6 and C6-C7. Other neck: Unremarkable. Upper chest: No apical lung mass. Review of the MIP images confirms the above findings CTA HEAD Anterior circulation: Intracranial internal carotid arteries are patent. Anterior cerebral arteries are patent. 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 Anterior communicating artery region is partially obscured by artifact. No definite residual or recurrent aneurysm. Middle cerebral arteries are patent. There is eccentric noncalcified plaque along the left M1/M2 MCA as it curves posteriorly causing mild to moderate stenosis. There is no abnormal vascularity in the region hemorrhage. Posterior circulation: Intracranial vertebral arteries, basilar artery, and posterior cerebral arteries are patent. Venous sinuses: Patent as allowed by contrast bolus timing. Review of the MIP images confirms the above findings IMPRESSION: Acute parenchymal hemorrhage involving the left basal ganglia with mild edema. No intraventricular extension or significant mass effect. No abnormal vascularity in the region of hemorrhage. These results were called by telephone at the time of interpretation on 03/14/2022 at 4:02 pm to provider DAVID YAO , who verbally acknowledged these results. Coiled anterior communicating artery region aneurysm. No definite residual or recurrent aneurysm. No hemodynamically significant stenosis  in the neck. Plaque causing mild to moderate stenosis of the left M1/M2 MCA as the vessel curves posteriorly. Nonspecific extensive paranasal sinus opacification and bilateral patchy mastoid and middle ear opacification. Electronically Signed   By: Macy Mis M.D.   On: 03/14/2022 16:12  ? ?CT HEAD WO CONTRAST (5MM) ? ?Result Date: 03/15/2022 ?CLINICAL DATA:  Intracranial hemorrhage EXAM: CT HEAD WITHOUT CONTRAST TECHNIQUE: Contiguous axial images were obtained from the base of the skull through the vertex without intravenous contrast. RADIATION DOSE REDUCTION: This exam was performed according to the departmental dose-optimization program which includes automated exposure control, adjustment of the Jatasia and/or kV according to patient size and/or use of iterative reconstruction technique. COMPARISON:  03/14/2022 FINDINGS: Brain: Redemonstrated hyperdense hemorrhage centered in the left lentiform nucleus, measuring 1.9 x 1.5 x 2.0 cm (AP x TR x CC) (series 3, image 19 and series 5, image 31), previously 2.0 x 1.5 x 1.9 cm overall unchanged. Again noted is ill-defined hemorrhage tracking towards the caudate. Unchanged mild surrounding edema, without evidence of intraventricular extension, significant mass effect, or midline shift. No acute infarct, mass, hydrocephalus, or extra-axial collection. Periventricular white matter changes, likely the sequela of chronic small vessel ischemic disease. Vascular: Prior A-comm aneurysm coiling.  No hyperdense vessel. Skull: No acute osseous abnormality. Sinuses/Orbits: Near-complete opacification of the entirety of the paranasal sinuses with osseous thickening, compatible with chronic sinusitis. The orbits are unremarkable. Other: Fluid in bilateral mastoid air cells. IMPRESSION: Unchanged acute intraparenchymal hemorrhage involving the left basal ganglia, with unchanged surrounding mild edema without significant mass effect or midline shift. No intraventricular extension.  Electronically Signed   By: Merilyn Baba M.D.   On: 03/15/2022 02:43  ? ?DG Swallowing Func-Speech Pathology ? ?Modified Barium Swallow Progress Note   Patient Details Name: Avienda Shinnick MRN: [id number] Date of Birth: June 03, 1961   Today's Date: 03/16/2022   Modified Barium Swallow completed.  Full report located under Chart Review in the Imaging Section.   Brief recommendations include the following:   Clinical Impression             Pt demonstrates a mild oral dysphagia which improved throughout study as more and more POs were given and pt appeared to better initaition lingual coordination. Pt initially unable to lateralize solids for mastication, but then achieved adequate mastication of nutrigrain bar with extra time. Initiation of swallowing with almost all boluses occurred at the level of the pyriform sinuses though only trace flash penetration occurred with thin liquids PAS 2. Pt may initiate a mechanical soft diet with thin liquids as long as alert and upright. SLP to f/u for tolerance.  Swallow Evaluation Recommendations        SLP Diet Recommendations: Dysphagia 3 (Mech soft) solids;Thin liquid    Liquid Administration via: Cup;Straw    Medication Administration: Whole meds with puree    Supervision: Staff to assist with self feeding    Compensations: Slow rate;Small sips/bites    Postural Changes: Seated upright at 90 degrees               DeBlois, Katherene Ponto 03/16/2022,2:20 PM Electronically signed by Jerene Pitch, CCC-SLP at 03/16/2022  2:20 PM  ? ?ECHOCARDIOGRAM COMPLETE ? ?Result Date: 03/15/2022 ?   ECHOCARDIOGRAM REPORT   Patient Name:   Makaylyn MODELLE MCARTHUR Date of Exam: 03/15/2022 Medical Rec #:  [id number]   Height:       59.0 in Accession #:    [id number]  Weight:       142.0 lb Date of Birth:  29-Sep-1961    BSA:          1.595 m? Patient Age:    54 years    BP:           133/95 mmHg Patient Gender: F  HR:           78 bpm. Exam Location:  Inpatient Procedure: 2D Echo, Cardiac Doppler and Color  Doppler Indications:    CVA  History:        Patient has no prior history of Echocardiogram examinations.  Sonographer:    Luisa Hart RDCS Referring Phys: [id number] [place]  1. Left ventri

## 2022-03-19 NOTE — Progress Notes (Signed)
Inpatient Rehabilitation  Patient information reviewed and entered into eRehab system by Gaetana Kawahara M. Dawon Troop, M.A., CCC/SLP, PPS Coordinator.  Information including medical coding, functional ability and quality indicators will be reviewed and updated through discharge.    

## 2022-03-19 NOTE — Plan of Care (Signed)
?  Problem: Consults ?Goal: RH STROKE PATIENT EDUCATION ?Description: See Patient Education module for education specifics  ?Outcome: Progressing ?  ?Problem: RH BOWEL ELIMINATION ?Goal: RH STG MANAGE BOWEL WITH ASSISTANCE ?Description: STG Manage Bowel with mod I Assistance. ?Outcome: Progressing ?Goal: RH STG MANAGE BOWEL W/MEDICATION W/ASSISTANCE ?Description: STG Manage Bowel with Medication with mod I  Assistance. ?Outcome: Progressing ?  ?Problem: RH BLADDER ELIMINATION ?Goal: RH STG MANAGE BLADDER WITH ASSISTANCE ?Description: STG Manage Bladder With toileting Assistance ?Outcome: Progressing ?  ?Problem: RH SAFETY ?Goal: RH STG ADHERE TO SAFETY PRECAUTIONS W/ASSISTANCE/DEVICE ?Description: STG Adhere to Safety Precautions With cues Assistance/Device. ?Outcome: Progressing ?  ?Problem: RH KNOWLEDGE DEFICIT ?Goal: RH STG INCREASE KNOWLEDGE OF DIABETES ?Description: Patient and family will be able to manage DM with medications and dietary modifications using handouts and educational resources independently ?Outcome: Progressing ?Goal: RH STG INCREASE KNOWLEDGE OF HYPERTENSION ?Description: Patient and family will be able to manage HTN with medications and dietary modifications using handouts and educational resources independently ?Outcome: Progressing ?Goal: RH STG INCREASE KNOWLEGDE OF HYPERLIPIDEMIA ?Description: Patient and family will be able to manage HLD with medications and dietary modifications using handouts and educational resources independently ?Outcome: Progressing ?Goal: RH STG INCREASE KNOWLEDGE OF STROKE PROPHYLAXIS ?Description: Patient and family will be able to manage Stroke risks with medications and dietary modifications using handouts and educational resources independently ?Outcome: Progressing ?  ?

## 2022-03-19 NOTE — Plan of Care (Signed)
?  Problem: RH Balance ?Goal: LTG Patient will maintain dynamic sitting balance (PT) ?Description: LTG:  Patient will maintain dynamic sitting balance with assistance during mobility activities (PT) ?Flowsheets (Taken 03/19/2022 1259) ?LTG: Pt will maintain dynamic sitting balance during mobility activities with:: Supervision/Verbal cueing ?Goal: LTG Patient will maintain dynamic standing balance (PT) ?Description: LTG:  Patient will maintain dynamic standing balance with assistance during mobility activities (PT) ?Flowsheets (Taken 03/19/2022 1259) ?LTG: Pt will maintain dynamic standing balance during mobility activities with:: Supervision/Verbal cueing ?  ?Problem: Sit to Stand ?Goal: LTG:  Patient will perform sit to stand with assistance level (PT) ?Description: LTG:  Patient will perform sit to stand with assistance level (PT) ?Flowsheets (Taken 03/19/2022 1259) ?LTG: PT will perform sit to stand in preparation for functional mobility with assistance level: Supervision/Verbal cueing ?  ?Problem: RH Bed Mobility ?Goal: LTG Patient will perform bed mobility with assist (PT) ?Description: LTG: Patient will perform bed mobility with assistance, with/without cues (PT). ?Flowsheets (Taken 03/19/2022 1259) ?LTG: Pt will perform bed mobility with assistance level of: Supervision/Verbal cueing ?  ?Problem: RH Bed to Chair Transfers ?Goal: LTG Patient will perform bed/chair transfers w/assist (PT) ?Description: LTG: Patient will perform bed to chair transfers with assistance (PT). ?Flowsheets (Taken 03/19/2022 1259) ?LTG: Pt will perform Bed to Chair Transfers with assistance level: Supervision/Verbal cueing ?  ?Problem: RH Car Transfers ?Goal: LTG Patient will perform car transfers with assist (PT) ?Description: LTG: Patient will perform car transfers with assistance (PT). ?Flowsheets (Taken 03/19/2022 1259) ?LTG: Pt will perform car transfers with assist:: Supervision/Verbal cueing ?  ?Problem: RH Ambulation ?Goal: LTG Patient  will ambulate in controlled environment (PT) ?Description: LTG: Patient will ambulate in a controlled environment, # of feet with assistance (PT). ?Flowsheets (Taken 03/19/2022 1259) ?LTG: Pt will ambulate in controlled environ  assist needed:: Supervision/Verbal cueing ?LTG: Ambulation distance in controlled environment: 163ft using LRAD ?Goal: LTG Patient will ambulate in home environment (PT) ?Description: LTG: Patient will ambulate in home environment, # of feet with assistance (PT). ?Flowsheets (Taken 03/19/2022 1259) ?LTG: Pt will ambulate in home environ  assist needed:: Supervision/Verbal cueing ?LTG: Ambulation distance in home environment: 39ft using LRAD ?  ?Problem: RH Stairs ?Goal: LTG Patient will ambulate up and down stairs w/assist (PT) ?Description: LTG: Patient will ambulate up and down # of stairs with assistance (PT) ?Flowsheets (Taken 03/19/2022 1259) ?LTG: Pt will ambulate up/down stairs assist needed:: Supervision/Verbal cueing ?LTG: Pt will  ambulate up and down number of stairs: 4 steps using handrails per home set-up ?  ?

## 2022-03-20 LAB — GLUCOSE, CAPILLARY
Glucose-Capillary: 136 mg/dL — ABNORMAL HIGH (ref 70–99)
Glucose-Capillary: 190 mg/dL — ABNORMAL HIGH (ref 70–99)
Glucose-Capillary: 156 mg/dL — ABNORMAL HIGH (ref 70–99)
Glucose-Capillary: 152 mg/dL — ABNORMAL HIGH (ref 70–99)

## 2022-03-20 NOTE — IPOC Note (Signed)
Overall Plan of Care (IPOC) ?Patient Details ?Name: Paula Combs ?MRN: [id number] ?DOB: Aug 28, 1961 ? ?Admitting Diagnosis: ICH (intracerebral hemorrhage) ([place]) ? ?Hospital Problems: Principal Problem: ?  ICH (intracerebral hemorrhage) ([place]) ? ? ? ? Functional Problem List: ?Nursing Bladder, Bowel, Medication Management, Safety, Endurance  ?PT Balance, Perception, Behavior, Safety, Edema, Sensory, Endurance, Skin Integrity, Motor, Nutrition, Pain  ?OT Balance, Cognition, Endurance, Motor, Perception, Safety, Sensory, Skin Integrity, Vision  ?SLP Cognition, Linguistic, Motor  ?TR    ?    ? Basic ADL?s: ?OT Grooming, Bathing, Dressing, Toileting  ? ?  Advanced  ADL?s: ?OT    ?   ?Transfers: ?PT Bed Mobility, Bed to Chair, Car, Furniture, Floor  ?OT Toilet, Tub/Shower  ? ?  Locomotion: ?PT Ambulation, Stairs  ? ?  Additional Impairments: ?OT Fuctional Use of Upper Extremity  ?SLP Swallowing, Communication ?comprehension, expression ?   ?TR    ? ? ?Anticipated Outcomes ?Item Anticipated Outcome  ?Self Feeding    ?Swallowing ? Sup A ?  ?Basic self-care ? supervision  ?Toileting ? supervision ?  ?Bathroom Transfers supervision  ?Bowel/Bladder ? manage bowel w mod I and bladder w toileting  ?Transfers ? supervision using LRAD  ?Locomotion ? supervision using LRAD  ?Communication ? Mod A  ?Cognition ? Min A  ?Pain ? n/a  ?Safety/Judgment ? manage safety w cues  ? ?Therapy Plan: ?PT Intensity: Minimum of 1-2 x/day ,45 to 90 minutes ?PT Frequency: 5 out of 7 days ?PT Duration Estimated Length of Stay: 10-14 days ?OT Intensity: Minimum of 1-2 x/day, 45 to 90 minutes ?OT Frequency: 5 out of 7 days ?OT Duration/Estimated Length of Stay: 10-14 days ?SLP Intensity: Minumum of 1-2 x/day, 30 to 90 minutes ?SLP Frequency: 3 to 5 out of 7 days ?SLP Duration/Estimated Length of Stay: 10-14 days  ? ?Due to the current state of emergency, patients may not be receiving their 3-hours of Medicare-mandated therapy. ? ? Team  Interventions: ?Nursing Interventions Bladder Management, Disease Management/Prevention, Medication Management, Discharge Planning, Bowel Management, Patient/Family Education  ?PT interventions Ambulation/gait training, Cognitive remediation/compensation, Discharge planning, DME/adaptive equipment instruction, Functional mobility training, Pain management, Psychosocial support, Splinting/orthotics, Therapeutic Activities, UE/LE Strength taining/ROM, Visual/perceptual remediation/compensation, Training and development officer, Community reintegration, Disease management/prevention, Neuromuscular re-education, Functional electrical stimulation, Patient/family education, Skin care/wound management, Stair training, Therapeutic Exercise, UE/LE Coordination activities  ?OT Interventions Training and development officer, DME/adaptive equipment instruction, Patient/family education, Therapeutic Activities, Wheelchair propulsion/positioning, Therapeutic Exercise, Psychosocial support, Functional electrical stimulation, Cognitive remediation/compensation, Community reintegration, Self Care/advanced ADL retraining, Functional mobility training, UE/LE Strength taining/ROM, UE/LE Coordination activities, Skin care/wound managment, Discharge planning, Neuromuscular re-education, Disease mangement/prevention, Pain management, Splinting/orthotics, Visual/perceptual remediation/compensation  ?SLP Interventions Cueing hierarchy, Dysphagia/aspiration precaution training, Environmental controls, Functional tasks, Patient/family education, Speech/Language facilitation, Therapeutic Activities  ?TR Interventions    ?SW/CM Interventions Discharge Planning, Psychosocial Support, Patient/Family Education, Disease Management/Prevention  ? ?Barriers to Discharge ?MD  Medical stability  ?Nursing Decreased caregiver support ?level entry with extended family  ?PT Home environment access/layout, Incontinence ?   ?OT Decreased caregiver support ?per son in  law; he works full time and daughter takes care of 3 small children during the day but will be home  ?SLP   ?   ?SW Insurance for SNF coverage, Other (comments) ?   ? ?Team Discharge Planning: ?Destination: PT-Home ,OT- Home , SLP-Home ?Projected Follow-up: PT-Outpatient PT, 24 hour supervision/assistance, Home health PT (HH vs OP), OT-  Home health OT, SLP-Home Health SLP, Outpatient SLP, 24 hour supervision/assistance ?Projected Equipment Needs: PT-To be  determined, OT- To be determined, SLP-None recommended by SLP ?Equipment Details: PT- , OT-  ?Patient/family involved in discharge planning: PT- Patient,  OT-Family member/caregiver, SLP-Patient, Family member/caregiver ? ?MD ELOS: 10-14d  ?Medical Rehab Prognosis:  Good ?Assessment: The patient has been admitted for CIR therapies with the diagnosis of ICH. The team will be addressing functional mobility, strength, stamina, balance, safety, adaptive techniques and equipment, self-care, bowel and bladder mgt, patient and caregiver education, BP management. Goals have been set at Sup. Anticipated discharge destination is Home ?. ? ? ? ?  ? ? ?See Team Conference Notes for weekly updates to the plan of care  ?

## 2022-03-20 NOTE — Progress Notes (Signed)
?                                                       PROGRESS NOTE ? ? ?Subjective/Complaints: ?Family at bedside limited English , Ipad not charged  ?Pt eating breakfast , 100% ? ?ROS- could not obtain  ?Objective: ?  ?No results found. ?Recent Labs  ?  03/18/22 ?0305 03/19/22 ?[id number]  ?WBC 7.5 6.5  ?HGB 11.4* 12.2  ?HCT 34.3* 36.9  ?PLT 358 352  ? ? ?Recent Labs  ?  03/18/22 ?0305 03/19/22 ?[id number]  ?NA 140 139  ?K 3.5 3.7  ?CL 111 106  ?CO2 21* 24  ?GLUCOSE 103* 137*  ?BUN 18 12  ?CREATININE 0.84 0.84  ?CALCIUM 9.0 9.3  ? ? ? ?Intake/Output Summary (Last 24 hours) at 03/20/2022 0833 ?Last data filed at 03/19/2022 1700 ?Gross per 24 hour  ?Intake 720 ml  ?Output --  ?Net 720 ml  ? ?  ? ?  ? ?Physical Exam: ?Vital Signs ?Blood pressure (!) 154/94, pulse 75, temperature 98 ?F (36.7 ?C), resp. rate 16, height 4\' 11"  (1.499 m), weight 62.2 kg, SpO2 93 %. ? ? ?General: No acute distress ?Mood and affect are appropriate ?Heart: Regular rate and rhythm no rubs murmurs or extra sounds ?Lungs: Clear to auscultation, breathing unlabored, no rales or wheezes ?Abdomen: Positive bowel sounds, soft nontender to palpation, nondistended ?Extremities: No clubbing, cyanosis, or edema ?Skin: No evidence of breakdown, no evidence of rash ?Neurologic: Cranial nerves II through XII intact, motor strength is 4/5 in RIght and 5/5 left deltoid, bicep, tricep, grip, hip flexor, knee extensors, ankle dorsiflexor and plantar flexor ? ?Musculoskeletal: Full range of motion in all 4 extremities. No joint swelling ? ? ?Assessment/Plan: ?1. Functional deficits which require 3+ hours per day of interdisciplinary therapy in a comprehensive inpatient rehab setting. ?Physiatrist is providing close team supervision and 24 hour management of active medical problems listed below. ?Physiatrist and rehab team continue to assess barriers to discharge/monitor patient progress toward functional and medical goals ? ?Care Tool: ? ?Bathing ?   ?   ?   ?  ?   ?Bathing assist Assist Level: Minimal Assistance - Patient > 75% ?  ?  ?Upper Body Dressing/Undressing ?Upper body dressing   ?  ?   ?Upper body assist Assist Level: Minimal Assistance - Patient > 75% ?   ?Lower Body Dressing/Undressing ?Lower body dressing ? ? ?   ?  ? ?  ? ?Lower body assist Assist for lower body dressing: Moderate Assistance - Patient 50 - 74% ?   ? ?Toileting ?Toileting    ?Toileting assist Assist for toileting: Moderate Assistance - Patient 50 - 74% ?  ?  ?Transfers ?Chair/bed transfer ? ?Transfers assist ?   ? ?Chair/bed transfer assist level: Minimal Assistance - Patient > 75% ?  ?  ?Locomotion ?Ambulation ? ? ?Ambulation assist ? ?   ? ?Assist level: Minimal Assistance - Patient > 75% ?Assistive device: Hand held assist ?Max distance: 148ft  ? ?Walk 10 feet activity ? ? ?Assist ?   ? ?Assist level: Minimal Assistance - Patient > 75% ?Assistive device: Hand held assist  ? ?Walk 50 feet activity ? ? ?Assist   ? ?Assist level: Minimal Assistance - Patient > 75% ?Assistive device: Hand held assist  ? ? ?  Walk 150 feet activity ? ? ?Assist Walk 150 feet activity did not occur: Safety/medical concerns ? ?  ?  ?  ? ?Walk 10 feet on uneven surface  ?activity ? ? ?Assist   ? ? ?Assist level: Minimal Assistance - Patient > 75% ?Assistive device: Other (comment) (railing)  ? ?Wheelchair ? ? ? ? ?Assist Is the patient using a wheelchair?: Yes (only for transport) ?  ?  ? ?Wheelchair assist level: Dependent - Patient 0% ?   ? ? ?Wheelchair 50 feet with 2 turns activity ? ? ? ?Assist ? ?  ?  ? ? ?Assist Level: Dependent - Patient 0%  ? ?Wheelchair 150 feet activity  ? ? ? ?Assist ?   ? ? ?Assist Level: Dependent - Patient 0%  ? ?Blood pressure (!) 154/94, pulse 75, temperature 98 ?F (36.7 ?C), resp. rate 16, height 4\' 11"  (1.499 m), weight 62.2 kg, SpO2 93 %. ? ?Medical Problem List and Plan: ?1. 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            -patient may shower ?             -ELOS/Goals: 10-14 days S ?          PT, OT , SLP CIR level  ?2.  Antithrombotics: ?-DVT/anticoagulation:  Mechanical:  Antiembolism stockings, knee (TED hose) Bilateral lower extremities ?            -antiplatelet therapy: none ?3. Pain Management: Tylenol as needed ?4. Mood: LCSW to evaluate and provide emotional support ?            -antipsychotic agents: n/a ?5. Neuropsych: This patient is not capable of making decisions on her own behalf. ?6. Skin/Wound Care: Routine skin care checks ?7. Dysphagia: provide assistance with set up of meals ?            --dysphagia 3 diet, thin liquids ?8: Right eye blindness: chronic ?9: DM2: Continue CBGs QID and SSI>>controlled ?            --on Jardiance and metformin at home ?            -restart metformin ?CBG (last 3)  ?Recent Labs  ?  03/19/22 ?1625 03/19/22 ?2024 03/20/22 ?0604  ?GLUCAP 127* 120* 136*  ? ? ?Controlled 5/5 ?10: Hypertension: closely monitor with goal sys BP < 160 ?Vitals:  ? 03/19/22 1920 03/20/22 0533  ?BP: (!) 142/91 (!) 154/94  ?Pulse: 79 75  ?Resp: 16 16  ?Temp: 98.2 ?F (36.8 ?C) 98 ?F (36.7 ?C)  ?SpO2: 96% 93%  ? ? ?11: Hyperlipidemia: statin on hold due to IPH ?            --home atorvastatin 40 mg (neuro rec: increasing dose at discharge) ?12: Primary open-angle glaucoma: continue Timoptic ?13: Overweight: BMI 27.7; dietary counseling  ?  ? ?LOS: ?2 days ?A FACE TO FACE EVALUATION WAS PERFORMED ? ?Luanna Salk Willam Munford ?03/20/2022, 8:33 AM  ? ? ? ?

## 2022-03-20 NOTE — Progress Notes (Signed)
Occupational Therapy Session Note ? ?Patient Details  ?Name: Paula Combs ?MRN: [id number] ?Date of Birth: 10-23-1961 ? ?Today's Date: 03/20/2022 ?OT Individual Time: 5638-7564 ?OT Individual Time Calculation (min): 60 min  ? ? ?Short Term Goals: ?Week 1:  OT Short Term Goal 1 (Week 1): Pt will complete toilet transfer with CGA ?OT Short Term Goal 2 (Week 1): Pt will complete LB dressing with min assist including brief, pants, and socks/shoes. ?OT Short Term Goal 3 (Week 1): Pt will complete tub shower transfer with CGA. ?OT Short Term Goal 4 (Week 1): Pt will complete grooming at sink in standing with supervision. ? ?Skilled Therapeutic Interventions/Progress Updates:  ?  Pt sitting up in w/c eating breakfast, and no signs of pain.  Pt required min-mod intermittent cues to slow pace of self feeding and take smaller sips of thin liquids from cup.  One episode of coughing but appeared to clear and cough resolved.  Pt agreeable to bathe and dress at sinkside.  Pt ambulated to sink with CGA and bathed UB with min assist sitting at w/c.  Pt doffed pants and bathed LB then donned clean pants at sit<>stand level all with CGA.  Pt brushed teeth with setp by step Vcs for sequencing and CGA in standing for balance.  Pt returned to sitting in w/c and transported to bedside.  Pt appeared to have water still in mouth and spit out without warning.  Made SPT aware of pts presentation during session.  Call bell in reach, seat alarm on.   ? ?Therapy Documentation ?Precautions:  ?Precautions ?Precautions: Fall, Other (comment) ?Precaution Comments: mild R hemiparesis, possible R inattention ?Restrictions ?Weight Bearing Restrictions: No ? ? ? ?Therapy/Group: Individual Therapy ? ?Dian Situ Linnaea Ahn ?03/20/2022, 2:40 PM ?

## 2022-03-20 NOTE — Progress Notes (Signed)
Speech Language Pathology Daily Session Note ? ?Patient Details  ?Name: Paula Combs ?MRN: [id number] ?Date of Birth: 06-28-1961 ? ?Today's Date: 03/20/2022 ?SLP Individual Time: 6568-1275 ?SLP Individual Time Calculation (min): 45 min ? ?Short Term Goals: ?Week 1: SLP Short Term Goal 1 (Week 1): Pt will consume current diet textures (Dysphagia 3 with thin liquids) with Min A for implementation of aspiration precautions and no evidence of aspiration. ?SLP Short Term Goal 2 (Week 1): Pt will follow one-step commands within functional and therapeutic tasks with 80% accuracy given Mod A. ?SLP Short Term Goal 3 (Week 1): Pt will name at least 10 items during simple convergent and divergent naming tasks given Mod A. ?SLP Short Term Goal 4 (Week 1): Pt will demonstrate improved intellectual awareness of situation and current deficits by naming reason for admission, 2 physical, and 2 communication deficits given Mod A. ?SLP Short Term Goal 5 (Week 1): Pt will sustain attention to task for 10 minutes with Min-Mod A. ? ?Skilled Therapeutic Interventions: Skilled ST treatment focused on swallowing and language goals. SLP facilitated session with support of interpreter via [employer]. 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 However, pt with increased success when asked questions in yes/no format. Pt unable to verbalize that she had a stroke, but when asked "did you have a stroke?" she responded with "yes." OT reported coughing incident with morning meal and possible oral holding of secretions or water following oral care this morning. SLP assessed tolerance of thin liquids. Pt consumed single and sequential sips through straw with what appeared to be swift swallow response and without overt s/sx of aspiration. Pt appears appropriate to continue consumption of thin  liquids by straw or cup. Continue full supervision with all intake. Of note, pt reports she does not like ice in her drinks. SLP then cued patient to take small sip of water and to orally expel into oral care basin to assess safety and efficiency with oral care. Despite cues, pt swallowed majority of water and only expelled trace amount. To avoid possible consumption of toothpaste/oral care products, recommend pt complete oral care with suction toothbrush at this time. SLP updated staff communication and set-up suction. Patient was left in bed with alarm activated and immediate needs within reach at end of session. Continue per current plan of care.   ?   ?Pain ?Pain Assessment ?Pain Scale: 0-10 ?Pain Score: 0-No pain ? ?Therapy/Group: Individual Therapy ? ?Sofia Vanmeter T Yechiel Erny ?03/20/2022, 5:14 PM ?

## 2022-03-20 NOTE — Progress Notes (Signed)
Physical Therapy Session Note ? ?Patient Details  ?Name: Paula Combs ?MRN: [id number] ?Date of Birth: 10-Sep-1961 ? ?Today's Date: 03/20/2022 ?PT Individual Time: 1020-1104 ?PT Individual Time Calculation (min): 44 min  ? ?Short Term Goals: ?Week 1:  PT Short Term Goal 1 (Week 1): Pt will perform supine<>sit with supervision ?PT Short Term Goal 2 (Week 1): Pt will perform sit<>stands using LRAD with CGA ?PT Short Term Goal 3 (Week 1): Pt will perform bed<>chair transfers using LRAD with CGA ?PT Short Term Goal 4 (Week 1): Pt will ambulate at least 1108ft using LRAD with CGA ?PT Short Term Goal 5 (Week 1): Pt will ascend/descend 4 steps using HRs with CGA ? ? ?Skilled Therapeutic Interventions/Progress Updates:  ?Patient not located in room on [facility] at start of session. Pt found sitting at nurse's station on 4W d/t room change, but bed not in room yet. Patient alert and agreeable to PT session. Ipad interpreter used for [employer] dialect throughout session.  ? ?Patient with no pain complaint throughout session. ? ?Therapeutic Activity: ?Bed Mobility: Patient performed  sit -->supine with minA for LE. VC/ tc required for technique in positioning and pushing with BLE toward HOB with MinA. ?Transfers: Patient performed sit<>stand and stand pivot transfers throughout session with MinA/ CGA. Provided verbal cues for technique. Squat pivot from w/c to bed to R side with CGA using bedrail for support. ? ?Gait Training:  ?Patient ambulated 80 ft using L hallway HR initially and adjusting to RW with CGA throughout. MinA provided for RW mgmt as pt tending to hold walker out at arm's length. Progressing trunk flexion throughout ambulation. Provided vc/ tc for safe RW mgmt, upright posture, increased step height/ length as pt steps with minimal foot advancement bilaterally. No change in steps noted throughout amb bout. ? ?Neuromuscular Re-ed: ?NMR facilitated during session with focus on standing balance. Pt guided in sit<>stand  technique with no AD. Blocked practice for improvement from MinA to near supervision. NMR performed for improvements in motor control and coordination, balance, sequencing, judgement, and self confidence/ efficacy in performing all aspects of mobility at highest level of independence.  ? ?Interpreter notes that pt is difficult to understand d/t slurring.  ? ?Patient supine  in bed at end of session with brakes locked, bed alarm set, and all needs within reach. ? ? ?Therapy Documentation ?Precautions:  ?Precautions ?Precautions: Fall, Other (comment) ?Precaution Comments: mild R hemiparesis, possible R inattention ?Restrictions ?Weight Bearing Restrictions: No ?General: ?  ?Vital Signs: ?  ?Pain: ?Pain Assessment ?Pain Scale: 0-10 ?Pain Score: 0-No pain ? ?Therapy/Group: Individual Therapy ? ?Loel Dubonnet PT, DPT ?03/20/2022, 6:24 PM  ?

## 2022-03-20 NOTE — Progress Notes (Signed)
Occupational Therapy Session Note ? ?Patient Details  ?Name: Paula Combs ?MRN: [id number] ?Date of Birth: 1960-12-13 ? ?Today's Date: 03/20/2022 ?OT Individual Time: 4503-8882 ?OT Individual Time Calculation (min): 27 min  ? ? ?Short Term Goals: ?Week 1:  OT Short Term Goal 1 (Week 1): Pt will complete toilet transfer with CGA ?OT Short Term Goal 2 (Week 1): Pt will complete LB dressing with min assist including brief, pants, and socks/shoes. ?OT Short Term Goal 3 (Week 1): Pt will complete tub shower transfer with CGA. ?OT Short Term Goal 4 (Week 1): Pt will complete grooming at sink in standing with supervision. ? ?Skilled Therapeutic Interventions/Progress Updates:  ?  Pt received semi-reclined in bed with NT present, agreeable to therapy bed level due to fatigue. Session focus on activity tolerance, RUE NMR in prep for improved ADL/IADL/func mobility performance + decreased caregiver burden. No c/o pain.  ? ?Use of stratus interpreter throughout session. Pt asking about when she could go home, educated on rehab process and goals.  ? ?Completed 2x10 chest and overhead press with use of 3 lb dowel rod + cross body and overhead punches with yellow theraband to promote RUE NMR/generalized activity tolerance. ? ?Pt left semi-reclined in bed with bed alarm engaged, call bell in reach, and all immediate needs met.  ? ? ?Therapy Documentation ?Precautions:  ?Precautions ?Precautions: Fall, Other (comment) ?Precaution Comments: mild R hemiparesis, possible R inattention ?Restrictions ?Weight Bearing Restrictions: No ? ?Pain: no c/o throughout ?  ?ADL: See Care Tool for more details. ? ?Therapy/Group: Individual Therapy ? ?Volanda Napoleon MS, OTR/L ? ?03/20/2022, 6:59 AM ?

## 2022-03-21 DIAGNOSIS — R1312 Dysphagia, oropharyngeal phase: Secondary | ICD-10-CM

## 2022-03-21 DIAGNOSIS — E669 Obesity, unspecified: Secondary | ICD-10-CM

## 2022-03-21 DIAGNOSIS — E1169 Type 2 diabetes mellitus with other specified complication: Secondary | ICD-10-CM

## 2022-03-21 DIAGNOSIS — I10 Essential (primary) hypertension: Secondary | ICD-10-CM

## 2022-03-21 LAB — GLUCOSE, CAPILLARY
Glucose-Capillary: 127 mg/dL — ABNORMAL HIGH (ref 70–99)
Glucose-Capillary: 100 mg/dL — ABNORMAL HIGH (ref 70–99)
Glucose-Capillary: 211 mg/dL — ABNORMAL HIGH (ref 70–99)

## 2022-03-21 NOTE — Progress Notes (Signed)
?                                                       PROGRESS NOTE ? ? ?Subjective/Complaints: ? No new issues per nursing. Slept last night. Appears comfortable ? ?ROS: limited due to language/communication  ? ? ?Objective: ?  ?No results found. ?Recent Labs  ?  03/19/22 ?4235  ?WBC 6.5  ?HGB 12.2  ?HCT 36.9  ?PLT 352  ? ?Recent Labs  ?  03/19/22 ?3614  ?NA 139  ?K 3.7  ?CL 106  ?CO2 24  ?GLUCOSE 137*  ?BUN 12  ?CREATININE 0.84  ?CALCIUM 9.3  ? ? ?Intake/Output Summary (Last 24 hours) at 03/21/2022 0953 ?Last data filed at 03/21/2022 0700 ?Gross per 24 hour  ?Intake 320 ml  ?Output --  ?Net 320 ml  ?  ? ?  ? ?Physical Exam: ?Vital Signs ?Blood pressure (!) 120/96, pulse 100, temperature 98.4 ?F (36.9 ?C), temperature source Oral, resp. rate 18, height 4\' 11"  (1.499 m), weight 62.2 kg, SpO2 97 %. ? ? ?Constitutional: No distress . Vital signs reviewed. ?HEENT: NCAT, EOMI, oral membranes moist ?Neck: supple ?Cardiovascular: RRR without murmur. No JVD    ?Respiratory/Chest: CTA Bilaterally without wheezes or rales. Normal effort    ?GI/Abdomen: BS +, non-tender, non-distended ?Ext: no clubbing, cyanosis, or edema ?Psych: pleasant and cooperative  ?Skin: No evidence of breakdown, no evidence of rash ?Neurologic: Cranial nerves II through XII intact, motor strength is 4/5 in RIght and 5/5 left deltoid, bicep, tricep, grip, hip flexor, knee extensors, ankle dorsiflexor and plantar flexor ? ?Musculoskeletal: Full range of motion in all 4 extremities. No joint swelling ? ? ?Assessment/Plan: ?1. Functional deficits which require 3+ hours per day of interdisciplinary therapy in a comprehensive inpatient rehab setting. ?Physiatrist is providing close team supervision and 24 hour management of active medical problems listed below. ?Physiatrist and rehab team continue to assess barriers to discharge/monitor patient progress toward functional and medical goals ? ?Care Tool: ? ?Bathing ?   ?   ?   ?  ?  ?Bathing assist Assist  Level: Minimal Assistance - Patient > 75% ?  ?  ?Upper Body Dressing/Undressing ?Upper body dressing   ?  ?   ?Upper body assist Assist Level: Minimal Assistance - Patient > 75% ?   ?Lower Body Dressing/Undressing ?Lower body dressing ? ? ?   ?  ? ?  ? ?Lower body assist Assist for lower body dressing: Moderate Assistance - Patient 50 - 74% ?   ? ?Toileting ?Toileting    ?Toileting assist Assist for toileting: Minimal Assistance - Patient > 75% ?  ?  ?Transfers ?Chair/bed transfer ? ?Transfers assist ?   ? ?Chair/bed transfer assist level: Minimal Assistance - Patient > 75% ?  ?  ?Locomotion ?Ambulation ? ? ?Ambulation assist ? ?   ? ?Assist level: Minimal Assistance - Patient > 75% ?Assistive device: Hand held assist ?Max distance: 136ft  ? ?Walk 10 feet activity ? ? ?Assist ?   ? ?Assist level: Minimal Assistance - Patient > 75% ?Assistive device: Hand held assist  ? ?Walk 50 feet activity ? ? ?Assist   ? ?Assist level: Minimal Assistance - Patient > 75% ?Assistive device: Hand held assist  ? ? ?Walk 150 feet activity ? ? ?Assist Walk 150 feet activity  did not occur: Safety/medical concerns ? ?  ?  ?  ? ?Walk 10 feet on uneven surface  ?activity ? ? ?Assist   ? ? ?Assist level: Minimal Assistance - Patient > 75% ?Assistive device: Other (comment) (railing)  ? ?Wheelchair ? ? ? ? ?Assist Is the patient using a wheelchair?: Yes (only for transport) ?  ?  ? ?Wheelchair assist level: Dependent - Patient 0% ?   ? ? ?Wheelchair 50 feet with 2 turns activity ? ? ? ?Assist ? ?  ?  ? ? ?Assist Level: Dependent - Patient 0%  ? ?Wheelchair 150 feet activity  ? ? ? ?Assist ?   ? ? ?Assist Level: Dependent - Patient 0%  ? ?Blood pressure (!) 120/96, pulse 100, temperature 98.4 ?F (36.9 ?C), temperature source Oral, resp. rate 18, height 4\' 11"  (1.499 m), weight 62.2 kg, SpO2 97 %. ? ?Medical Problem List and Plan: ?1. 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            -patient may shower ?             -ELOS/Goals: 10-14 days S ?          -Continue CIR therapies including PT, OT, and SLP  ?2.  Antithrombotics: ?-DVT/anticoagulation:  Mechanical:  Antiembolism stockings, knee (TED hose) Bilateral lower extremities ?            -antiplatelet therapy: none ?3. Pain Management: Tylenol as needed ?4. Mood: LCSW to evaluate and provide emotional support ?            -antipsychotic agents: n/a ?5. Neuropsych: This patient is not capable of making decisions on her own behalf. ?6. Skin/Wound Care:  local care ?7. Dysphagia: provide assistance with set up of meals ?            --dysphagia 3 diet, thin liquids---tolerating ?8: Right eye blindness: chronic ?9: DM2: Continue CBGs QID and SSI>>controlled ?            --on Jardiance and metformin at home ?            -restarted metformin ?CBG (last 3)  ?Recent Labs  ?  03/20/22 ?1643 03/20/22 ?2106 03/21/22 ?0606  ?GLUCAP 156* 152* 127*  ? ?Improved control 5/6 ?10: Hypertension: closely monitor with goal sys BP < 160 ?Vitals:  ? 03/20/22 1941 03/21/22 0227  ?BP: 134/90 (!) 120/96  ?Pulse: (!) 102 100  ?Resp: 18 18  ?Temp: 97.7 ?F (36.5 ?C) 98.4 ?F (36.9 ?C)  ?SpO2: 94% 97%  ? -DBP elevated--observe today ? ?11: Hyperlipidemia: statin on hold due to IPH ?            --home atorvastatin 40 mg (neuro rec: increasing dose at discharge) ?12: Primary open-angle glaucoma: continue Timoptic ?13: Overweight: BMI 27.7; dietary counseling  ?  ? ?LOS: ?3 days ?A FACE TO FACE EVALUATION WAS PERFORMED ? ?05/21/22 ?03/21/2022, 9:53 AM  ? ? ? ?

## 2022-03-21 NOTE — Progress Notes (Signed)
Speech Language Pathology Daily Session Note ? ?Patient Details  ?Name: Paula Combs ?MRN: [id number] ?Date of Birth: July 03, 1961 ? ?Today's Date: 03/21/2022 ?SLP Individual Time: 2683-4196 ?SLP Individual Time Calculation (min): 44 min ? ?Short Term Goals: ?Week 1: SLP Short Term Goal 1 (Week 1): Pt will consume current diet textures (Dysphagia 3 with thin liquids) with Min A for implementation of aspiration precautions and no evidence of aspiration. ?SLP Short Term Goal 2 (Week 1): Pt will follow one-step commands within functional and therapeutic tasks with 80% accuracy given Mod A. ?SLP Short Term Goal 3 (Week 1): Pt will name at least 10 items during simple convergent and divergent naming tasks given Mod A. ?SLP Short Term Goal 4 (Week 1): Pt will demonstrate improved intellectual awareness of situation and current deficits by naming reason for admission, 2 physical, and 2 communication deficits given Mod A. ?SLP Short Term Goal 5 (Week 1): Pt will sustain attention to task for 10 minutes with Min-Mod A. ? ?Skilled Therapeutic Interventions: ? Pt seen for skilled ST with focus on swallowing and language goals, pt upright in wheelchair and agreeable to therapeutic tasks. SLP facilitating session with support of audio interpretor via [employer]. Similar to previous session, interpretor had significant difficulty understanding pt's expressive language, reporting "nonsense words" and "stuttering". Interpretor was able to understand pt saying "I don't know" 1x during session, all other language unable to be interpreted. SLP attempting convergent and divergent naming tasks however language barriers impacted activity. SLP facilitating trials of both Dys 3 and regular texture solids, pt demonstrating mild extended mastication of Dys 3 textures with effective oral clearing. Pt with moderately severe extended mastication/oral phase of regular solids and required liquid assistance and liquid wash to clear. Pt denies further regular  textures after 1 bite. Pt with no overt s/s aspiration with thin liquids via cup this date. SLP facilitating simple card sorting task to target following directions and sustained attention, pt requiring extra time to understand instructions however max A cues fading to min A as exercise progressed. Pt was left in wheelchair with alarm belt intact and all needs within reach. Cont ST POC. ? ?Pain ?Pain Assessment ?Pain Scale: 0-10 ?Pain Score: 0-No pain ? ?Therapy/Group: Individual Therapy ? ?Tacey Ruiz ?03/21/2022, 9:39 AM ?

## 2022-03-21 NOTE — Progress Notes (Addendum)
Physical Therapy Session Note ? ?Patient Details  ?Name: Paula Combs ?MRN: [id number] ?Date of Birth: [birth year] ? ?Today's Date: 03/21/2022 ?PT Individual Time: 1545-1600   ?Session Time: 15 mins ? ? ?General: ?PT Amount of Missed Time (min): 45 Minutes ?PT Missed Treatment Reason: Other (Comment)  ? ?Short Term Goals: ?Week 1:  PT Short Term Goal 1 (Week 1): Pt will perform supine<>sit with supervision ?PT Short Term Goal 2 (Week 1): Pt will perform sit<>stands using LRAD with CGA ?PT Short Term Goal 3 (Week 1): Pt will perform bed<>chair transfers using LRAD with CGA ?PT Short Term Goal 4 (Week 1): Pt will ambulate at least 18ft using LRAD with CGA ?PT Short Term Goal 5 (Week 1): Pt will ascend/descend 4 steps using HRs with CGA ? ?Skilled Therapeutic Interventions/Progress Updates:  ?  Chart reviewed. PT entered room and greeted pt, who was pleasant. Attempted to call interpreter on language iPad. After long hold, no speaker of the pt's language [name]) could be found. PT attempted to communicate with pt, but pt was not able to understand simple verbal instruction in English or gesturing. Thus, for safety, PT plans to re-attempt brief session after afternoon OT session if language interpreter available. PT informed RN of plan. PT returned to room at 3:45pm and was able to complete 15 min session that focused on ambulation endurance. Pt had no c/o pain. Language interpreter was available, so pt completed 2x531ft amb with CGA + RW. Of note, pt required extensive VC for walker safety. At end of session, pt was left seated in [facility] as found, with alarm engaged, nurse call bell and all needs in reach. ? ?  ? ?Therapy Documentation ?Precautions:  ?Precautions ?Precautions: Fall, Other (comment) ?Precaution Comments: mild R hemiparesis, possible R inattention ?Restrictions ?Weight Bearing Restrictions: No ? ? ? ? ?Therapy/Group: Individual Therapy ? ?Dionne Milo, PT, DPT ?03/21/2022, 4:11 PM  ?

## 2022-03-21 NOTE — Progress Notes (Signed)
Occupational Therapy Session Note ? ?Patient Details  ?Name: Paula Combs ?MRN: [id number] ?Date of Birth: 1961-11-06 ? ?Today's Date: 03/21/2022 ?OT Individual Time: 9379-0240 session 1 ?OT Individual Time Calculation (min): 70 min  ?Session 2: 9735-3299 ? ?Short Term Goals: ?Week 1:  OT Short Term Goal 1 (Week 1): Pt will complete toilet transfer with CGA ?OT Short Term Goal 2 (Week 1): Pt will complete LB dressing with min assist including brief, pants, and socks/shoes. ?OT Short Term Goal 3 (Week 1): Pt will complete tub shower transfer with CGA. ?OT Short Term Goal 4 (Week 1): Pt will complete grooming at sink in standing with supervision. ? ?Skilled Therapeutic Interventions/Progress Updates:  ?Session 1: Pt greeted supine in bed, utilized [employer] interpreter with pt agreeable to OT intervention. Session focus on RUE coordination, functional mobility, dynamic standing balance and decreasing overall caregiver burden.     ?Pt completed bed mobility with HOB elevated with overall CGA. MIN A for sit>stand and pivot to w/c with RW.  Pt completed grooming tasks at sink with set- up but declined further BADLS, total A transport from room to gym in w/c. Used      ?BITS to work on dynamic standing balance, R attention and RUE coordination. Pt instructed to reach for stimulus on screen with RUE in standing with overall CGA, pt completed task with 71% accuracy, very slow to initiate reaching and  ?Noted to undershoot with RUE. Pt then reports fatigue needing seated rest break, pt abruptly reports need to void bladder,total A transport back to room, where pt completed MINA ambulatory transfer to toilet with RW, MINA for 3/3 toileting tasks. Pt completed standing grooming tasks at sink with CGA. Pt left seated in w/c with alarm belt activated and all needs within reach.      ?Of note interpreter continues to report that pt speaks very softly with interpreter having to ask pt to repeat herself multiple times. ? ?Session 2: Pt  greeted supine in bed with phone interpreter, pt  agreeable to OT intervention. Session focus on BADL reeducation, functional mobility, dynamic standing balance, RUE FMC and decreasing overall caregiver burden.      ?Of noted the phone interpreter continues to be very difficulty to use as interpreter states pts speech is slurred and sometimes nonsensically, pt would greatly benefit from in person interpreter.      ?Pt completed supine>sit with CGA. Pt completed stand pivot to w/c with RW and MINA. Pt transported to gym with total A where pt completed standing therapeutic activity using peg board to work on RUE coordination and dual processing. Pt first needed this OTA to start pattern then pt able to complete peg board pattern with MOD verbal cues for sequencing and technique, good R attention noted. Pt completed task with CGA for balance. Pt also completed ~ 15 ft of functional ambulation with Rw and MIN A, attempted to grade task up and have pt ambulate around room to retrieve horseshoes on R side of environment however when providing instructions to pt, pts speech with to "jumbled" for interpreter to interpret, therefore terminated task. Pt returned to room with total A where pt reports need to void bladder, MINA for ambulatory toilet transfer and MIN A for 3/3 toilteing tasks. pt left seated in w/c with alarm belt activated and all needs within reach.                    ?            ?  Therapy Documentation ?Precautions:  ?Precautions ?Precautions: Fall, Other (comment) ?Precaution Comments: mild R hemiparesis, possible R inattention ?Restrictions ?Weight Bearing Restrictions: No ? ? ?Pain: no pain reported during either session  ?Therapy/Group: Individual Therapy ? ?Barron Schmid ?03/21/2022, 9:17 AM ?

## 2022-03-22 LAB — GLUCOSE, CAPILLARY
Glucose-Capillary: 137 mg/dL — ABNORMAL HIGH (ref 70–99)
Glucose-Capillary: 157 mg/dL — ABNORMAL HIGH (ref 70–99)
Glucose-Capillary: 115 mg/dL — ABNORMAL HIGH (ref 70–99)

## 2022-03-22 MED ORDER — METFORMIN HCL 500 MG PO TABS
500.0000 mg | ORAL_TABLET | Freq: Two times a day (BID) | ORAL | Status: DC
  Administered 2022-03-22 – 2022-03-27 (×10): 500 mg via ORAL
  Filled 2022-03-22 (×10): qty 1

## 2022-03-22 NOTE — Progress Notes (Signed)
?                                                       PROGRESS NOTE ? ? ?Subjective/Complaints: ? Had a quiet night. No new issues.  ? ?ROS: limited due to language/communication  ? ? ?Objective: ?  ?No results found. ?No results for input(s): WBC, HGB, HCT, PLT in the last 72 hours. ? ?No results for input(s): NA, K, CL, CO2, GLUCOSE, BUN, CREATININE, CALCIUM in the last 72 hours. ? ? ?Intake/Output Summary (Last 24 hours) at 03/22/2022 0858 ?Last data filed at 03/22/2022 0857 ?Gross per 24 hour  ?Intake 418 ml  ?Output --  ?Net 418 ml  ?  ? ?  ? ?Physical Exam: ?Vital Signs ?Blood pressure 129/79, pulse 72, temperature 97.8 ?F (36.6 ?C), temperature source Oral, resp. rate 18, height 4\' 11"  (1.499 m), weight 62.2 kg, SpO2 95 %. ? ? ?Constitutional: No distress . Vital signs reviewed. ?HEENT: NCAT, EOMI, oral membranes moist ?Neck: supple ?Cardiovascular: RRR without murmur. No JVD    ?Respiratory/Chest: CTA Bilaterally without wheezes or rales. Normal effort    ?GI/Abdomen: BS +, non-tender, non-distended ?Ext: no clubbing, cyanosis, or edema ?Psych: pleasant and cooperative   ?Skin: No evidence of breakdown, no evidence of rash ?Neurologic: Cranial nerves II through XII intact, motor strength is 4/5 in RIght and 5/5 left deltoid, bicep, tricep, grip, hip flexor, knee extensors, ankle dorsiflexor and plantar flexor ? ?Musculoskeletal: Full range of motion in all 4 extremities. No joint swelling ? ? ?Assessment/Plan: ?1. Functional deficits which require 3+ hours per day of interdisciplinary therapy in a comprehensive inpatient rehab setting. ?Physiatrist is providing close team supervision and 24 hour management of active medical problems listed below. ?Physiatrist and rehab team continue to assess barriers to discharge/monitor patient progress toward functional and medical goals ? ?Care Tool: ? ?Bathing ?   ?   ?   ?  ?  ?Bathing assist Assist Level: Minimal Assistance - Patient > 75% ?  ?  ?Upper Body  Dressing/Undressing ?Upper body dressing   ?  ?   ?Upper body assist Assist Level: Minimal Assistance - Patient > 75% ?   ?Lower Body Dressing/Undressing ?Lower body dressing ? ? ?   ?  ? ?  ? ?Lower body assist Assist for lower body dressing: Moderate Assistance - Patient 50 - 74% ?   ? ?Toileting ?Toileting    ?Toileting assist Assist for toileting: Minimal Assistance - Patient > 75% ?  ?  ?Transfers ?Chair/bed transfer ? ?Transfers assist ?   ? ?Chair/bed transfer assist level: Minimal Assistance - Patient > 75% ?  ?  ?Locomotion ?Ambulation ? ? ?Ambulation assist ? ?   ? ?Assist level: Minimal Assistance - Patient > 75% ?Assistive device: Hand held assist ?Max distance: 111ft  ? ?Walk 10 feet activity ? ? ?Assist ?   ? ?Assist level: Minimal Assistance - Patient > 75% ?Assistive device: Hand held assist  ? ?Walk 50 feet activity ? ? ?Assist   ? ?Assist level: Minimal Assistance - Patient > 75% ?Assistive device: Hand held assist  ? ? ?Walk 150 feet activity ? ? ?Assist Walk 150 feet activity did not occur: Safety/medical concerns ? ?  ?  ?  ? ?Walk 10 feet on uneven surface  ?activity ? ? ?Assist   ? ? ?  Assist level: Minimal Assistance - Patient > 75% ?Assistive device: Other (comment) (railing)  ? ?Wheelchair ? ? ? ? ?Assist Is the patient using a wheelchair?: Yes (only for transport) ?  ?  ? ?Wheelchair assist level: Dependent - Patient 0% ?   ? ? ?Wheelchair 50 feet with 2 turns activity ? ? ? ?Assist ? ?  ?  ? ? ?Assist Level: Dependent - Patient 0%  ? ?Wheelchair 150 feet activity  ? ? ? ?Assist ?   ? ? ?Assist Level: Dependent - Patient 0%  ? ?Blood pressure 129/79, pulse 72, temperature 97.8 ?F (36.6 ?C), temperature source Oral, resp. rate 18, height 4\' 11"  (1.499 m), weight 62.2 kg, SpO2 95 %. ? ?Medical Problem List and Plan: ?1. 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            -patient may shower ?            -ELOS/Goals: 10-14 days S ?        -Continue CIR therapies  including PT, OT, and SLP   ?2.  Antithrombotics: ?-DVT/anticoagulation:  Mechanical:  Antiembolism stockings, knee (TED hose) Bilateral lower extremities ?            -antiplatelet therapy: none ?3. Pain Management: Tylenol as needed ?4. Mood: LCSW to evaluate and provide emotional support ?            -antipsychotic agents: n/a ?5. Neuropsych: This patient is not capable of making decisions on her own behalf. ?6. Skin/Wound Care:  local care ?7. Dysphagia: provide assistance with set up of meals ?            --dysphagia 3 diet, thin liquids---tolerating ?8: Right eye blindness: chronic ?9: DM2: Continue CBGs QID and SSI>>controlled ?            --on Jardiance and metformin at home ?            5/7-increase metformin to 500mg  bid. On 1000mg  bid at home ?CBG (last 3)  ?Recent Labs  ?  03/21/22 ?1149 03/21/22 ?2057 03/22/22 ?0612  ?GLUCAP 100* 211* 137*  ? ? ?10: Hypertension: closely monitor with goal sys BP < 160 ?Vitals:  ? 03/21/22 1935 03/22/22 0226  ?BP: 130/87 129/79  ?Pulse: 85 72  ?Resp: 18 18  ?Temp: 98.3 ?F (36.8 ?C) 97.8 ?F (36.6 ?C)  ?SpO2: 95% 95%  ? -5/7 bp improved today. No changes ? ?11: Hyperlipidemia: statin on hold due to IPH ?            --home atorvastatin 40 mg (neuro rec: increasing dose at discharge) ?12: Primary open-angle glaucoma: continue Timoptic ?13: Overweight: BMI 27.7; dietary counseling  ?  ? ?LOS: ?4 days ?A FACE TO FACE EVALUATION WAS PERFORMED ? ?Meredith Staggers ?03/22/2022, 8:58 AM  ? ? ? ?

## 2022-03-23 LAB — GLUCOSE, CAPILLARY
Glucose-Capillary: 198 mg/dL — ABNORMAL HIGH (ref 70–99)
Glucose-Capillary: 110 mg/dL — ABNORMAL HIGH (ref 70–99)
Glucose-Capillary: 111 mg/dL — ABNORMAL HIGH (ref 70–99)
Glucose-Capillary: 126 mg/dL — ABNORMAL HIGH (ref 70–99)
Glucose-Capillary: 113 mg/dL — ABNORMAL HIGH (ref 70–99)
Glucose-Capillary: 156 mg/dL — ABNORMAL HIGH (ref 70–99)
Glucose-Capillary: 159 mg/dL — ABNORMAL HIGH (ref 70–99)

## 2022-03-23 NOTE — Progress Notes (Signed)
Occupational Therapy Session Note ? ?Patient Details  ?Name: Paula Combs ?MRN: [id number] ?Date of Birth: 02-05-1961 ? ?Today's Date: 03/23/2022 ?OT Individual Time: 1607-3710 ?OT Individual Time Calculation (min): 41 min  ? ? ?Short Term Goals: ?Week 1:  OT Short Term Goal 1 (Week 1): Pt will complete toilet transfer with CGA ?OT Short Term Goal 2 (Week 1): Pt will complete LB dressing with min assist including brief, pants, and socks/shoes. ?OT Short Term Goal 3 (Week 1): Pt will complete tub shower transfer with CGA. ?OT Short Term Goal 4 (Week 1): Pt will complete grooming at sink in standing with supervision. ? ?Skilled Therapeutic Interventions/Progress Updates:  ?Pt greeted supine in bed  agreeable to OT intervention, utilized video interpreter during session. Session focus on functional mobility, dynamic standing balance, RUE coordination, R sided attention, attention to task, problem solving and decreasing overall caregiver burden.        ?Pt completed supine >sit to EOB with CGA. Pt completed stand pivot to w/c with RW and MINA. Pt needs MODA verbal/tactile cues for hand placement during sit>stand. Pt transported to tub room with total A, earlier today pt reported she had a tub shower however once pt looking at tub shower in tub room pt reports, "No its not like that." Unsure of validity of this statement but will confirm with daughter, pt was able to complete stand pivot to TTB with Rw and MINA, MOD multimodal cues for technique as pt getting R and L side confused during transfer.  ?Remainder of session focus on dynamic standing balance, RUE coordination, R sided attention, attention to task, and problem solving where pt instructed to place shapes on mirror with RUE, pt initially using LUE only but with increased multimodal cues pt able to complete task with CGA for balance. Graded task up and had pt place shapes on certain area of mirror to facilitate R sided attention with pt able to complete task with CGA  for balance and MIN multimodal cues for accuracy, attention to L side and problem solving.  ?Pt transported back to room with total A where pt completed ambulatory transfer to bathroom with RW and MINA, MIN A for 3/3 toileting tasks needing assist for balance and clothing mgmt.  ?pt left supine in bed with bed alarm activated and all needs within reach.                ? ? ?Therapy Documentation ?Precautions:  ?Precautions ?Precautions: Fall, Other (comment) ?Precaution Comments: mild R hemiparesis, possible R inattention ?Restrictions ?Weight Bearing Restrictions: No ? ?Pain: no pain reported during session  ? ? ? ?Therapy/Group: Individual Therapy ? ?Barron Schmid ?03/23/2022, 2:56 PM ?

## 2022-03-23 NOTE — Progress Notes (Signed)
Physical Therapy Session Note ? ?Patient Details  ?Name: Paula Combs ?MRN: [id number] ?Date of Birth: Dec 16, 1960 ? ?Today's Date: 03/23/2022 ?PT Individual Time: 0800-0900 ?PT Individual Time Calculation (min): 60 min  ? ?Short Term Goals: ?Week 1:  PT Short Term Goal 1 (Week 1): Pt will perform supine<>sit with supervision ?PT Short Term Goal 2 (Week 1): Pt will perform sit<>stands using LRAD with CGA ?PT Short Term Goal 3 (Week 1): Pt will perform bed<>chair transfers using LRAD with CGA ?PT Short Term Goal 4 (Week 1): Pt will ambulate at least 127ft using LRAD with CGA ?PT Short Term Goal 5 (Week 1): Pt will ascend/descend 4 steps using HRs with CGA ? ?Skilled Therapeutic Interventions/Progress Updates:  ?  Pt received seated in bed finishing breakfast, agreeable to PT session. Utilized [employer] interpreter for Santiago Glad, interpreter Paw [id number]. Pt with no complaints of pain this AM. Supine to sit with Supervision with HOB elevated, use of bedrail. Sit to stand and stand pivot transfer bed to w/c with min A. Ambulation x 70 ft with RW and CGA for balance, cues to keep RW close to body and for increased RLE clearance. Navigation through obstacle course weaving through cones with RW and CGA for balance, cues for safe RW management. Pt with difficulty following verbal cues for safety throughout session. Standing alt L/R cone taps with RW and CGA for balance, 2 x 10 reps for LE coordination training. Sidesteps L/R 2 x 10 reps with RW and CGA for balance with cues for correct body positioning. Pt reports urgent need to have a BM. Toilet transfer with RW and CGA. Pt is min A for clothing management and needs some assist for pericare for thoroughness. Pt agreeable to remain seated in w/c at end of session, quick release belt and chair alarm in place, needs in reach. ? ?Therapy Documentation ?Precautions:  ?Precautions ?Precautions: Fall, Other (comment) ?Precaution Comments: mild R hemiparesis, possible R  inattention ?Restrictions ?Weight Bearing Restrictions: No ? ? ? ? ? ? ?Therapy/Group: Individual Therapy ? ? ?Excell Seltzer, PT, DPT, CSRS ?03/23/2022, 12:00 PM  ?

## 2022-03-23 NOTE — Progress Notes (Signed)
Occupational Therapy Session Note ? ?Patient Details  ?Name: Paula Combs ?MRN: [id number] ?Date of Birth: 1961/04/20 ? ?Today's Date: 03/23/2022 ?OT Individual Time: 4782-9562 ?OT Individual Time Calculation (min): 57 min  ? ? ?Short Term Goals: ?Week 1:  OT Short Term Goal 1 (Week 1): Pt will complete toilet transfer with CGA ?OT Short Term Goal 2 (Week 1): Pt will complete LB dressing with min assist including brief, pants, and socks/shoes. ?OT Short Term Goal 3 (Week 1): Pt will complete tub shower transfer with CGA. ?OT Short Term Goal 4 (Week 1): Pt will complete grooming at sink in standing with supervision. ? ?Skilled Therapeutic Interventions/Progress Updates:  ?Pt greeted seated in w/c agreeable to OT intervention, used video interpreter during session. Session focus on BADL reeducation, functional mobility, dynamic standing balance, RUE FMC and decreasing overall caregiver burden.       ?Pt declined shower but agreeable to wash up at sink, pt completed bathing with overall CGA via sit>stand. Pt completed UB dressing with set- up assist and declined donning new pants. Pt completed standing oral care at sink with CGA. Pt transported to gym from w/c with total A for time mgmt. Pt completed 9 hole peg test assessment with results as indicated below:      ?LUE- 2 mins and 17 secs ?RUE- 2 mins and 7 secs  ? ?Slower speed likely also d/t impaired cognition and language barrier as instructions were issued at a slower rate, MIN tactile cues needed to only use one UE at a time. Pt reports it was harder to complete task with RUE. Remainder of session work on RUE [facility] wit pt able to complete additional peg board task with RUE and supervision. Pt transported back to room with total A where pt completed stand pivot back to bed with Rw and MINA.         pt left supine in bed with bed alarm activated and all needs within reach.                     ? ? ?Therapy Documentation ?Precautions:  ?Precautions ?Precautions: Fall, Other  (comment) ?Precaution Comments: mild R hemiparesis, possible R inattention ?Restrictions ?Weight Bearing Restrictions: No ? ?Pain: no pain reported during session  ? ? ? ?Therapy/Group: Individual Therapy ? ?Barron Schmid ?03/23/2022, 12:17 PM ?

## 2022-03-23 NOTE — Progress Notes (Signed)
?                                                       PROGRESS NOTE ? ? ?Subjective/Complaints: ?  ?Smiling , very limited English, no interpreter in rm ? ?ROS: limited due to language/communication  ? ? ?Objective: ?  ?No results found. ?No results for input(s): WBC, HGB, HCT, PLT in the last 72 hours. ? ?No results for input(s): NA, K, CL, CO2, GLUCOSE, BUN, CREATININE, CALCIUM in the last 72 hours. ? ? ?Intake/Output Summary (Last 24 hours) at 03/23/2022 0830 ?Last data filed at 03/22/2022 1309 ?Gross per 24 hour  ?Intake 236 ml  ?Output --  ?Net 236 ml  ? ?  ? ?  ? ?Physical Exam: ?Vital Signs ?Blood pressure 112/79, pulse 81, temperature 98.1 ?F (36.7 ?C), temperature source Oral, resp. rate 18, height 4\' 11"  (1.499 m), weight 62.2 kg, SpO2 95 %. ? ? ?General: No acute distress ?Mood and affect are appropriate ?Heart: Regular rate and rhythm no rubs murmurs or extra sounds ?Lungs: Clear to auscultation, breathing unlabored, no rales or wheezes ?Abdomen: Positive bowel sounds, soft nontender to palpation, nondistended ?Extremities: No clubbing, cyanosis, or edema ?Skin: No evidence of breakdown, no evidence of rash ? ? ?Skin: No evidence of breakdown, no evidence of rash ?Neurologic: Cranial nerves II through XII intact, motor strength is 4/5 in RIght and 5/5 left deltoid, bicep, tricep, grip, hip flexor, knee extensors, ankle dorsiflexor and plantar flexor ? ?Musculoskeletal: Full range of motion in all 4 extremities. No joint swelling ? ? ?Assessment/Plan: ?1. Functional deficits which require 3+ hours per day of interdisciplinary therapy in a comprehensive inpatient rehab setting. ?Physiatrist is providing close team supervision and 24 hour management of active medical problems listed below. ?Physiatrist and rehab team continue to assess barriers to discharge/monitor patient progress toward functional and medical goals ? ?Care Tool: ? ?Bathing ?   ?   ?   ?  ?  ?Bathing assist Assist Level: Minimal Assistance  - Patient > 75% ?  ?  ?Upper Body Dressing/Undressing ?Upper body dressing   ?  ?   ?Upper body assist Assist Level: Minimal Assistance - Patient > 75% ?   ?Lower Body Dressing/Undressing ?Lower body dressing ? ? ?   ?  ? ?  ? ?Lower body assist Assist for lower body dressing: Moderate Assistance - Patient 50 - 74% ?   ? ?Toileting ?Toileting    ?Toileting assist Assist for toileting: Minimal Assistance - Patient > 75% ?  ?  ?Transfers ?Chair/bed transfer ? ?Transfers assist ?   ? ?Chair/bed transfer assist level: Minimal Assistance - Patient > 75% ?  ?  ?Locomotion ?Ambulation ? ? ?Ambulation assist ? ?   ? ?Assist level: Minimal Assistance - Patient > 75% ?Assistive device: Hand held assist ?Max distance: 168ft  ? ?Walk 10 feet activity ? ? ?Assist ?   ? ?Assist level: Minimal Assistance - Patient > 75% ?Assistive device: Hand held assist  ? ?Walk 50 feet activity ? ? ?Assist   ? ?Assist level: Minimal Assistance - Patient > 75% ?Assistive device: Hand held assist  ? ? ?Walk 150 feet activity ? ? ?Assist Walk 150 feet activity did not occur: Safety/medical concerns ? ?  ?  ?  ? ?Walk 10 feet on uneven  surface  ?activity ? ? ?Assist   ? ? ?Assist level: Minimal Assistance - Patient > 75% ?Assistive device: Other (comment) (railing)  ? ?Wheelchair ? ? ? ? ?Assist Is the patient using a wheelchair?: Yes (only for transport) ?  ?  ? ?Wheelchair assist level: Dependent - Patient 0% ?   ? ? ?Wheelchair 50 feet with 2 turns activity ? ? ? ?Assist ? ?  ?  ? ? ?Assist Level: Dependent - Patient 0%  ? ?Wheelchair 150 feet activity  ? ? ? ?Assist ?   ? ? ?Assist Level: Dependent - Patient 0%  ? ?Blood pressure 112/79, pulse 81, temperature 98.1 ?F (36.7 ?C), temperature source Oral, resp. rate 18, height 4\' 11"  (1.499 m), weight 62.2 kg, SpO2 95 %. ? ?Medical Problem List and Plan: ?1. 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            -patient may shower ?            -ELOS/Goals: 10-14 days  S ?        -Continue CIR therapies including PT, OT, and SLP   ?2.  Antithrombotics: ?-DVT/anticoagulation:  Mechanical:  Antiembolism stockings, knee (TED hose) Bilateral lower extremities ?            -antiplatelet therapy: none ?3. Pain Management: Tylenol as needed ?4. Mood: LCSW to evaluate and provide emotional support ?            -antipsychotic agents: n/a ?5. Neuropsych: This patient is not capable of making decisions on her own behalf. ?6. Skin/Wound Care:  local care ?7. Dysphagia: provide assistance with set up of meals ?            --dysphagia 3 diet, thin liquids---tolerating ?8: Right eye blindness: chronic ?9: DM2: Continue CBGs QID and SSI>>controlled ?            --on Jardiance and metformin at home ?            5/7-increase metformin to 500mg  bid. On 1000mg  bid at home ?CBG (last 3)  ?Recent Labs  ?  03/22/22 ?1714 03/22/22 ?2100 03/23/22 ?0601  ?GLUCAP 157* 115* 111*  ? ?Controlled 5/8 ? ?10: Hypertension: closely monitor with goal sys BP < 160 ?Vitals:  ? 03/22/22 1922 03/23/22 0400  ?BP: (!) 132/97 112/79  ?Pulse: 84 81  ?Resp: 18 18  ?Temp: 98.3 ?F (36.8 ?C) 98.1 ?F (36.7 ?C)  ?SpO2: 96% 95%  ? Controlled 5/8 ? ?11: Hyperlipidemia: statin on hold due to IPH ?            --home atorvastatin 40 mg (neuro rec: increasing dose at discharge) ?12: Primary open-angle glaucoma: continue Timoptic ?13: Overweight: BMI 27.7; dietary counseling  ?  ? ?LOS: ?5 days ?A FACE TO FACE EVALUATION WAS PERFORMED ? ?05/23/22 Dallon Dacosta ?03/23/2022, 8:30 AM  ? ? ? ?

## 2022-03-23 NOTE — Progress Notes (Signed)
Speech Language Pathology Daily Session Note ? ?Patient Details  ?Name: Paula Combs ?MRN: [id number] ?Date of Birth: January 07, 1961 ? ?Today's Date: 03/23/2022 ?SLP Individual Time: 0076-2263 ?SLP Individual Time Calculation (min): 59 min ? ?Short Term Goals: ?Week 1: SLP Short Term Goal 1 (Week 1): Pt will consume current diet textures (Dysphagia 3 with thin liquids) with Min A for implementation of aspiration precautions and no evidence of aspiration. ?SLP Short Term Goal 2 (Week 1): Pt will follow one-step commands within functional and therapeutic tasks with 80% accuracy given Mod A. ?SLP Short Term Goal 3 (Week 1): Pt will name at least 10 items during simple convergent and divergent naming tasks given Mod A. ?SLP Short Term Goal 4 (Week 1): Pt will demonstrate improved intellectual awareness of situation and current deficits by naming reason for admission, 2 physical, and 2 communication deficits given Mod A. ?SLP Short Term Goal 5 (Week 1): Pt will sustain attention to task for 10 minutes with Min-Mod A. ? ?Skilled Therapeutic Interventions:Skilled ST services focused on language and cognitive skills. SLP utilized [employer] interpretor. SLP facilitated word finding skills in simple divergent and convergent naming tasks. Pt demonstrated ability to name members in a category 2-3 within a minute, increasing to 4-5 with max A semantic cues and pt was able to name category given 2 members with 50% accuracy increasing to 100% with max A semantic cues. Interpretor noted phonemic and semantic errors. 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 SLP also facilitated following 1 step commands and sustained attention in card sorting task by 6 colors pt demonstrated mod I after initial instruction, however when task changed to sorting by 3 shapes pt required max A multimodal cues to comprehend concept with eventual ability to complete task mod I. Pt was left  in room with call bell within reach and bed alarm set. SLP recommends to continue skilled services. ?   ? ?Pain ?Pain Assessment ?Pain Score: 0-No pain ? ?Therapy/Group: Individual Therapy ? ?Trisha Morandi ?03/23/2022, 1:39 PM ?

## 2022-03-24 LAB — GLUCOSE, CAPILLARY
Glucose-Capillary: 117 mg/dL — ABNORMAL HIGH (ref 70–99)
Glucose-Capillary: 143 mg/dL — ABNORMAL HIGH (ref 70–99)
Glucose-Capillary: 166 mg/dL — ABNORMAL HIGH (ref 70–99)
Glucose-Capillary: 180 mg/dL — ABNORMAL HIGH (ref 70–99)

## 2022-03-24 NOTE — Progress Notes (Signed)
Physical Therapy Session Note ? ?Patient Details  ?Name: Paula Combs ?MRN: [id number] ?Date of Birth: 1961-07-10 ? ?Today's Date: 03/24/2022 ?PT Individual Time: 8676-7209 and 1310-1409 ?PT Individual Time Calculation (min): 25 min  and 59 min ?and  ?Today's Date: 03/24/2022 ?PT Missed Time: 35 Minutes ?Missed Time Reason: Other (Comment);Nursing care (meal) ? ?Short Term Goals: ?Week 1:  PT Short Term Goal 1 (Week 1): Pt will perform supine<>sit with supervision ?PT Short Term Goal 2 (Week 1): Pt will perform sit<>stands using LRAD with CGA ?PT Short Term Goal 3 (Week 1): Pt will perform bed<>chair transfers using LRAD with CGA ?PT Short Term Goal 4 (Week 1): Pt will ambulate at least 119ft using LRAD with CGA ?PT Short Term Goal 5 (Week 1): Pt will ascend/descend 4 steps using HRs with CGA ? ?Skilled Therapeutic Interventions/Progress Updates:  ?  Session 1: Pt received supine in bed with NT present to provide full supervision for pt to starting to eat breakfast. Ipad interpreter being used for communication. Pt requiring additional time to complete meal - therapist needing to return after. Missed 35 minutes of skilled physical therapy. ? ?Therapist returned upon pt completion of meal. Ipad video interpreter via [employer] continued to be used throughout session (Dah #[id number]). Pt agreeable to therapy session. Supine>sitting R EOB, HOB elevated and using bedrail as needed, with supervision for safety. Sit<>stands, no AD, with light min assist for balance and safety throughout session. Gait training ~35ft x2 in/out bathroom, no AD, with min assist for balance - pt frequently reaching out to hold onto things for support with L UE - cuing to maintain upright posture to improve balance. Standing with light min assist, using UE support on grab bar as needed, while pt performed LB clothing management with increased time but no assist. Continent of bladder and performed seated peri-care without assist. Gait to sink as described  above. Requires mod cuing to locate faucet handles to turn on water, to locate the soap, and to locate the paper towels - completed hand hygiene without physical assist doing well to incorporate both hands. Gait to drawers to grab pants with continued min assist as described above - pt with difficulty understanding therapist's instructions to gather clothes to pt on. Pt requires safety cuing to sit on EOB to don pants rather than trying to do it in standing - donned without assist. Donned tennis shoes with max assist because pt tied them before putting them on (this may be how pt did it at baseline) R stand pivot to w/c, no AD with light min assist. Pt left seated in w/c with needs in reach and seat belt alarm on.  ? ? ? ?Session 2: Pt received sitting in w/c and agreeable to therapy session. [employer] video interpreter on Ipad used (Naw #[id number]). Pt asking about D/C plan - educated on plan for team conference tomorrow to discuss this as well as need for family education/training prior to D/C. Reached out to SW to gather information regarding family support. ? ?Sit<>stands, no AD, with CGA/light min assist for steadying throughout session - slow to rise. ? ?Gait training ~121ft to main therapy gym, no AD, with min assist for balance due to R posterior lean - continues to have slow gait speed with decreased B LE step lengths and slight excessive hip external rotation bilaterally - decreased B LE foot clearance (R more impaired) as well as short step lengths that turn into more of a shuffled gait pattern towards end when fatigued. ? ?  Donned 4lb ankle weight on R LE.  ? ?Stair navigation training ascending/descending 4 steps x2 (6" height) using B HRs with light min assist - step-to pattern with pt varying lead LE  in each direction though often leading with R LE on ascent and L LE on descent, which promotes increased R LE strengthening. ? ?Dynamic standing balance task via alternate B LE foot taps to 4" step while  wearing 4lb ankle weight on R LE transitioned to no weight on R LE due to difficulty - min assist for balance - pt with difficult lifting R LE to place on step collapsing trunk forward and towards R due to poor L weight shift and difficulty maintaining trunk upright while trying to lift R LE, doffed the weight to see if that would improve the motor plan but little change noticed. ? ?Transitioned to dynamic forward/backwards stepping over hockey stick with varying the lead leg in each direction - consistent min assist for balance - pt with difficulty bringing R LE back after stepping backwards with L LE leading - donned 4lb ankle weight on R LE for added motor recruitment with minimal change noted. ? ?Dynamic gait training task using agility ladder of trying to achieve alternate foot steps going forward - requires consistent min assist for balance as well as manual faiclitation for weight shifting onto stance limb - pt with improving step lengths but was not truly able to achieve full reciprocal step often stepping on the ladder reigns. ? ?Gait training ~120ft back to room, no AD, with lighter min assist and pt demonstrating increasing gait speed, increasing  BLE step lengths, and increasing B LE foot clearances. ? ?Pt reports need to use bathroom - gait in/out with min assist and performed toileting tasks as noted during 1st session. ? ?Pt requesting to return to bed and with poor safety awareness starts to climb onto bed on hands and knees requiring cuing to safely turn and sit. Pt left supine in bed with needs in reach and bed alarm on. ? ? ?Therapy Documentation ?Precautions:  ?Precautions ?Precautions: Fall, Other (comment) ?Precaution Comments: mild R hemiparesis, possible R inattention ?Restrictions ?Weight Bearing Restrictions: No ? ? ?Pain: ? Session 1: No reports or indications of pain throughout session. ? ?Session 2: Denies pain during session. ? ? ?Therapy/Group: Individual Therapy ? ?Ginny Forth ,  PT, DPT, NCS, CSRS ?03/24/2022, 7:51 AM  ?

## 2022-03-24 NOTE — Progress Notes (Signed)
Speech Language Pathology Daily Session Note ? ?Patient Details  ?Name: Paula Combs ?MRN: [id number] ?Date of Birth: [birth year] ? ?Today's Date: 03/24/2022 ?SLP Individual Time: 4259-5638 ?SLP Individual Time Calculation (min): 41 min ? ?Short Term Goals: ?Week 1: SLP Short Term Goal 1 (Week 1): Pt will consume current diet textures (Dysphagia 3 with thin liquids) with Min A for implementation of aspiration precautions and no evidence of aspiration. ?SLP Short Term Goal 2 (Week 1): Pt will follow one-step commands within functional and therapeutic tasks with 80% accuracy given Mod A. ?SLP Short Term Goal 3 (Week 1): Pt will name at least 10 items during simple convergent and divergent naming tasks given Mod A. ?SLP Short Term Goal 4 (Week 1): Pt will demonstrate improved intellectual awareness of situation and current deficits by naming reason for admission, 2 physical, and 2 communication deficits given Mod A. ?SLP Short Term Goal 5 (Week 1): Pt will sustain attention to task for 10 minutes with Min-Mod A. ? ?Skilled Therapeutic Interventions: Skilled ST treatment focused on cognitive and language goals. SLP utilized [employer] interpreter for duration of session. Patient was oriented to person, place, and situation with sup A verbal cues; max A to orient to time. SLP facilitated picture naming using stimuli from [employer] "Naming" app. Pt required max A verbal cues including semantic features and field of 2 choices to achieve 25% accuracy. 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 Many times interpreter stated patient occasionally verbalized words that resembled target word while accompanied by "other words that don't make sense." Pt required max fading to min A verbal cues to verbally repeat at the word level to achieve 75% accuracy. Patient generated up to 3 words within target category with max A semantic cues and additional clarification to comprehend  concept. Patient was left in bed with alarm activated and immediate needs within reach at end of session. Continue per current plan of care.   ?   ?Pain ?Pain Assessment ?Pain Scale: 0-10 ?Pain Score: 0-No pain ? ?Therapy/Group: Individual Therapy ? ?Jalessa Peyser T Yanice Maqueda ?03/24/2022, 2:15 PM ?

## 2022-03-24 NOTE — Progress Notes (Signed)
?                                                       PROGRESS NOTE ? ? ?Subjective/Complaints: ?  ?Follows basic commands with gestural cues, smiling sitting in wheelchair ? ?ROS: limited due to language/communication  ? ? ?Objective: ?  ?No results found. ?No results for input(s): WBC, HGB, HCT, PLT in the last 72 hours. ? ?No results for input(s): NA, K, CL, CO2, GLUCOSE, BUN, CREATININE, CALCIUM in the last 72 hours. ? ? ?Intake/Output Summary (Last 24 hours) at 03/24/2022 0851 ?Last data filed at 03/23/2022 1900 ?Gross per 24 hour  ?Intake 354 ml  ?Output --  ?Net 354 ml  ? ?  ? ?  ? ?Physical Exam: ?Vital Signs ?Blood pressure 115/75, pulse 94, temperature 97.6 ?F (36.4 ?C), resp. rate 18, height 4\' 11"  (1.499 m), weight 62.2 kg, SpO2 95 %. ? ? ?General: No acute distress ?Mood and affect are appropriate ?Heart: Regular rate and rhythm no rubs murmurs or extra sounds ?Lungs: Clear to auscultation, breathing unlabored, no rales or wheezes ?Abdomen: Positive bowel sounds, soft nontender to palpation, nondistended ?Extremities: No clubbing, cyanosis, or edema ?Skin: No evidence of breakdown, no evidence of rash ? ? ?Skin: No evidence of breakdown, no evidence of rash ?Neurologic: Cranial nerves II through XII intact, motor strength is 4/5 in RIght and 5/5 left deltoid, bicep, tricep, grip, hip flexor, knee extensors, ankle dorsiflexor and plantar flexor ? ?Musculoskeletal: Full range of motion in all 4 extremities. No joint swelling ? ? ?Assessment/Plan: ?1. Functional deficits which require 3+ hours per day of interdisciplinary therapy in a comprehensive inpatient rehab setting. ?Physiatrist is providing close team supervision and 24 hour management of active medical problems listed below. ?Physiatrist and rehab team continue to assess barriers to discharge/monitor patient progress toward functional and medical goals ? ?Care Tool: ? ?Bathing ?   ?Body parts bathed by patient: Right arm, Left arm, Chest, Abdomen,  Front perineal area, Buttocks  ?   ?  ?  ?Bathing assist Assist Level: Contact Guard/Touching assist ?  ?  ?Upper Body Dressing/Undressing ?Upper body dressing   ?What is the patient wearing?: Pull over shirt ?   ?Upper body assist Assist Level: Set up assist ?   ?Lower Body Dressing/Undressing ?Lower body dressing ? ? ?   ?  ? ?  ? ?Lower body assist Assist for lower body dressing: Moderate Assistance - Patient 50 - 74% ?   ? ?Toileting ?Toileting    ?Toileting assist Assist for toileting: Minimal Assistance - Patient > 75% ?  ?  ?Transfers ?Chair/bed transfer ? ?Transfers assist ?   ? ?Chair/bed transfer assist level: Minimal Assistance - Patient > 75% ?  ?  ?Locomotion ?Ambulation ? ? ?Ambulation assist ? ?   ? ?Assist level: Minimal Assistance - Patient > 75% ?Assistive device: Hand held assist ?Max distance: 118ft  ? ?Walk 10 feet activity ? ? ?Assist ?   ? ?Assist level: Minimal Assistance - Patient > 75% ?Assistive device: Hand held assist  ? ?Walk 50 feet activity ? ? ?Assist   ? ?Assist level: Minimal Assistance - Patient > 75% ?Assistive device: Hand held assist  ? ? ?Walk 150 feet activity ? ? ?Assist Walk 150 feet activity did not occur: Safety/medical concerns ? ?  ?  ?  ? ?  Walk 10 feet on uneven surface  ?activity ? ? ?Assist   ? ? ?Assist level: Minimal Assistance - Patient > 75% ?Assistive device: Other (comment) (railing)  ? ?Wheelchair ? ? ? ? ?Assist Is the patient using a wheelchair?: Yes (only for transport) ?Type of Wheelchair: Manual ?  ? ?Wheelchair assist level: Dependent - Patient 0% ?   ? ? ?Wheelchair 50 feet with 2 turns activity ? ? ? ?Assist ? ?  ?  ? ? ?Assist Level: Dependent - Patient 0%  ? ?Wheelchair 150 feet activity  ? ? ? ?Assist ?   ? ? ?Assist Level: Dependent - Patient 0%  ? ?Blood pressure 115/75, pulse 94, temperature 97.6 ?F (36.4 ?C), resp. rate 18, height 4\' 11"  (1.499 m), weight 62.2 kg, SpO2 95 %. ? ?Medical Problem List and Plan: ?1. 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            -patient may shower ?            -ELOS/Goals: 10-14 days S, team conference in a.m. ?        -Continue CIR therapies including PT, OT, and SLP   ?2.  Antithrombotics: ?-DVT/anticoagulation:  Mechanical:  Antiembolism stockings, knee (TED hose) Bilateral lower extremities ?            -antiplatelet therapy: none ?3. Pain Management: Tylenol as needed ?4. Mood: LCSW to evaluate and provide emotional support ?            -antipsychotic agents: n/a ?5. Neuropsych: This patient is not capable of making decisions on her own behalf. ?6. Skin/Wound Care:  local care ?7. Dysphagia: provide assistance with set up of meals ?            --dysphagia 3 diet, thin liquids---tolerating ?8: Right eye blindness: chronic ?9: DM2: Continue CBGs QID and SSI>>controlled ?            --on Jardiance and metformin at home ?            5/7-increase metformin to 500mg  bid. On 1000mg  bid at home ?CBG (last 3)  ?Recent Labs  ?  03/23/22 ?2050 03/23/22 ?2106 03/24/22 ?05/23/22  ?GLUCAP 159* 156* 117*  ? ?Controlled 5/9 ? ?10: Hypertension: closely monitor with goal sys BP < 160 ?Vitals:  ? 03/23/22 1944 03/24/22 0313  ?BP: 138/89 115/75  ?Pulse: 88 94  ?Resp: 16 18  ?Temp: (!) 97.2 ?F (36.2 ?C) 97.6 ?F (36.4 ?C)  ?SpO2: 96% 95%  ? Controlled 5/9 ? ?11: Hyperlipidemia: statin on hold due to IPH ?            --home atorvastatin 40 mg (neuro rec: increasing dose at discharge) ?12: Primary open-angle glaucoma: continue Timoptic ?13: Overweight: BMI 27.7; dietary counseling  ?  ? ?LOS: ?6 days ?A FACE TO FACE EVALUATION WAS PERFORMED ? ?05/23/22 Jamespaul Secrist ?03/24/2022, 8:51 AM  ? ? ? ?

## 2022-03-24 NOTE — Progress Notes (Signed)
Occupational Therapy Session Note ? ?Patient Details  ?Name: Paula Combs ?MRN: [id number] ?Date of Birth: 04-16-1961 ? ?Today's Date: 03/24/2022 ?OT Individual Time: 1191-4782 ?OT Individual Time Calculation (min): 40 min  ? ? ?Short Term Goals: ?Week 1:  OT Short Term Goal 1 (Week 1): Pt will complete toilet transfer with CGA ?OT Short Term Goal 2 (Week 1): Pt will complete LB dressing with min assist including brief, pants, and socks/shoes. ?OT Short Term Goal 3 (Week 1): Pt will complete tub shower transfer with CGA. ?OT Short Term Goal 4 (Week 1): Pt will complete grooming at sink in standing with supervision. ? ?Skilled Therapeutic Interventions/Progress Updates:  ?  Patient received up in wheelchair with alarm belt in place.  Used Nurse, children's [name]) to communicate with patient.  Today's session focused on functional mobility with walker in room, sit to stand with use of arm rests versus pulling on walker, door navigation.  Worked on standing balance static to dynamic, and freeing hands from support for functional tasks, and reaching outside base of support.  Also stand tolerance - standing to brush teeth at sink.  Patient asked to go to bathroom, and navigated with walker safely around obstacles without cueing - could use further practice on directional changes, turning/backing up, etc.  Toileting with contact guard - allowing time for initiation of each step.   ?Patient returned to wheelchair with safety  belt in place and engaged, and brief reorientation to call bell.  Call bell in reach.   ? ?Therapy Documentation ?Precautions:  ?Precautions ?Precautions: Fall, Other (comment) ?Precaution Comments: mild R hemiparesis, possible R inattention ?Restrictions ?Weight Bearing Restrictions: No ?  ?  ?Pain: ?0/10 ? ? ? ?Therapy/Group: Individual Therapy ? ?Collier Salina ?03/24/2022, 12:13 PM ?

## 2022-03-25 LAB — CBC
HCT: 38.1 % (ref 36.0–46.0)
Hemoglobin: 12.5 g/dL (ref 12.0–15.0)
MCH: 30.9 pg (ref 26.0–34.0)
MCHC: 32.8 g/dL (ref 30.0–36.0)
MCV: 94.1 fL (ref 80.0–100.0)
Platelets: 327 10*3/uL (ref 150–400)
RBC: 4.05 MIL/uL (ref 3.87–5.11)
RDW: 11.9 % (ref 11.5–15.5)
WBC: 7.2 10*3/uL (ref 4.0–10.5)
nRBC: 0 % (ref 0.0–0.2)

## 2022-03-25 LAB — BASIC METABOLIC PANEL
Anion gap: 8 (ref 5–15)
BUN: 23 mg/dL (ref 8–23)
CO2: 22 mmol/L (ref 22–32)
Calcium: 9.5 mg/dL (ref 8.9–10.3)
Chloride: 108 mmol/L (ref 98–111)
Creatinine, Ser: 0.94 mg/dL (ref 0.44–1.00)
GFR, Estimated: >60 mL/min (ref >60)
Glucose, Bld: 118 mg/dL — ABNORMAL HIGH (ref 70–99)
Potassium: 4 mmol/L (ref 3.5–5.1)
Sodium: 138 mmol/L (ref 135–145)

## 2022-03-25 LAB — GLUCOSE, CAPILLARY
Glucose-Capillary: 128 mg/dL — ABNORMAL HIGH (ref 70–99)
Glucose-Capillary: 120 mg/dL — ABNORMAL HIGH (ref 70–99)
Glucose-Capillary: 129 mg/dL — ABNORMAL HIGH (ref 70–99)
Glucose-Capillary: 102 mg/dL — ABNORMAL HIGH (ref 70–99)

## 2022-03-25 NOTE — Progress Notes (Signed)
Occupational Therapy Session Note ? ?Patient Details  ?Name: Paula Combs ?MRN: [id number] ?Date of Birth: 1961-05-28 ? ?Today's Date: 03/25/2022 ?OT Individual Time: 7106-2694 ?OT Individual Time Calculation (min): 43 min  ? ? ?Short Term Goals: ?Week 1:  OT Short Term Goal 1 (Week 1): Pt will complete toilet transfer with CGA ?OT Short Term Goal 2 (Week 1): Pt will complete LB dressing with min assist including brief, pants, and socks/shoes. ?OT Short Term Goal 3 (Week 1): Pt will complete tub shower transfer with CGA. ?OT Short Term Goal 4 (Week 1): Pt will complete grooming at sink in standing with supervision. ? ?Skilled Therapeutic Interventions/Progress Updates:  ?Pt greeted supine in bed reporting not feeling well but no pain reported and no further explanation stated about her general malaise per video interpreter. Pt eventually    agreeable to OT intervention. Session focus on BADL reeducation, functional mobility, R sided attention, visual scanning, dynamic standing balance and decreasing overall caregiver burden.   ?CGA for supine>sit, pt declined shower or changing clothes but able to ambulate to sink to stand for grooming tasks with CGA- supervision.  Pt needed + time to locate toothbrush on R side and initiate steps for oral care such as turning on water. Supervision for grooming tasks at sink. Pt transported to gym in w/c with total A. Pt completed therapeutic activity of ambulating in gym to locate horseshoes on R and L side of room. Pt needed MOD verbal cues to locate all horseshoes in room with CGA with RW ( increased difficulty locating horseshoes on R side). Pt also completed seated therapeutic activity of manipulating weighted clothespins to place on line with RUE, pt completed task with supervision. Pt transported back to room with total A where pt completed stand pivot to EOB with CGA and RW. Pt  left supine in bed with bed alarm activated and all needs within reach.                 ? ? ?Therapy  Documentation ?Precautions:  ?Precautions ?Precautions: Fall, Other (comment) ?Precaution Comments: mild R hemiparesis, possible R inattention ?Restrictions ?Weight Bearing Restrictions: No ? ?Pain: no pain reported during session  ? ? ? ?Therapy/Group: Individual Therapy ? ?Barron Schmid ?03/25/2022, 12:26 PM ?

## 2022-03-25 NOTE — Progress Notes (Signed)
Physical Therapy Session Note ? ?Patient Details  ?Name: Paula Combs ?MRN: [id number] ?Date of Birth: [birth year] ? ?Today's Date: 03/25/2022 ?PT Individual Time: [id number] and [id number] ?PT Individual Time Calculation (min): 54 min and 44 min  ? ?Short Term Goals: ?Week 1:  PT Short Term Goal 1 (Week 1): Pt will perform supine<>sit with supervision ?PT Short Term Goal 2 (Week 1): Pt will perform sit<>stands using LRAD with CGA ?PT Short Term Goal 3 (Week 1): Pt will perform bed<>chair transfers using LRAD with CGA ?PT Short Term Goal 4 (Week 1): Pt will ambulate at least 121ft using LRAD with CGA ?PT Short Term Goal 5 (Week 1): Pt will ascend/descend 4 steps using HRs with CGA ? ?Skilled Therapeutic Interventions/Progress Updates:  ?  Session 1: Pt received supine in bed resting, but agreeable to therapy session. Provided pt pants and she donned them supine in bed (self selected technique because she had the space to do it sitting EOB) with increased time using bridge technique but no assist needed. Supine>sitting R EOB, HOB flat and not using bedrail, with close supervision. Sit<>stands using RW with CGA for safety during session. Gait training ~176ft to main therapy gym using RW with CGA for steadying and pt requiring manual facilitation and verbal cuing to maintain RW close to her - able to sustain improved positioning of AD during session with intermittent verbal cuing - demos improved gait speed and more stability using RW. Educated pt on recommendation to use RW at home and pt in agreement. ? ?Gait training around obstacles in gym using RW working on AD management with pt demoing ability to maintain AD close to her body - she does have slow, gradual turns with more difficulty making sharper turns - continued CGA for steadying. ? ?Stair navigation training ascending/descending 4 (6" steps) starting with B HRs progressed to only R HR using HHA on opposite side - ascends reciprocally and descends primarily step-to  leading with LLE - CGA using B HRs and light min assist when using only R HR and HHA. ? ?Dynamic gait training task focusing on R attention, R UE NMR, and RW management while collecting clothespins with R hand, placing them on her gait belt (R side), and walking them over to place on clothespin line on R - CGA for steadying balance throughout.  ? ?Gait training ~150ft back to room using RW with CGA and pt continuing to demo improved gait safety and independence using RW - good carryover of cuing to maintain RW close to her. At end of session, pt left seated in w/c with needs in reach and seat belt alarm on. ? ? ? ?Session 2: Pt received sitting in w/c and agreeable to therapy session. Video Interpreter on Ipad used for communication throughout session (Tabe [phone]). Pt spontaneously tells therapist about her situation stating she has had a stroke, is weak, and has difficult speaking - therapist confirmed this and educated pt on importance of continuing to participate with therapy to improve these deficits as well as educated on her improvements thus far. Pt then reports she has been incontinent of bowels. Sit<>stands using RW with CGA for safety during session - pt continues to pull up with both hands on RW when coming to stand. Gait in/out bathroom using RW with CGA for steadying/safety and intermittent cuing for safe AD management sequencing. Standing with CGA/close supervision for balance during dependent assist LB clothing management and peri-care to ensure cleanliness - further continent of bladder on toilet.  ? ?  Gait training ~157ft x2 to/from main therapy gym using RW with CGA for steadying and intermittent min assist for balance and to facilitate maintaining upright posture and keeping AD closer because pt demos worsening anterior trunk lean pushing RW too far forward this afternoon compared to during the AM - continues to have slow gait speed with decreased B LE step lengths and shuffled gait pattern when  leaning too far forward and becoming fatigued. ? ?Once in gym, pt reports being very cold - took temperature upon return to pt's room (97.5) - therapist provided pt with heated blanket and adjusted room temperature. Sit>supine with close supervision and pt left supine in bed with needs in reach and bed alarm on. ? ? ?Therapy Documentation ?Precautions:  ?Precautions ?Precautions: Fall, Other (comment) ?Precaution Comments: mild R hemiparesis, possible R inattention ?Restrictions ?Weight Bearing Restrictions: No ? ? ?Pain: ?Session 1: Denies pain during session. ? ?Session 2: No reports of pain throughout session. ? ? ? ?Therapy/Group: Individual Therapy ? ?Ginny Forth , PT, DPT, NCS, CSRS ?03/25/2022, 7:53 AM  ?

## 2022-03-25 NOTE — Progress Notes (Signed)
?                                                       PROGRESS NOTE ? ? ?Subjective/Complaints: ?  ?Appears comfortable  ? ?ROS: limited due to language/communication  ? ? ?Objective: ?  ?No results found. ?Recent Labs  ?  03/25/22 ?6294  ?WBC 7.2  ?HGB 12.5  ?HCT 38.1  ?PLT 327  ? ? ?Recent Labs  ?  03/25/22 ?7654  ?NA 138  ?K 4.0  ?CL 108  ?CO2 22  ?GLUCOSE 118*  ?BUN 23  ?CREATININE 0.94  ?CALCIUM 9.5  ? ? ? ?Intake/Output Summary (Last 24 hours) at 03/25/2022 0932 ?Last data filed at 03/25/2022 6503 ?Gross per 24 hour  ?Intake 600 ml  ?Output --  ?Net 600 ml  ? ?  ? ?  ? ?Physical Exam: ?Vital Signs ?Blood pressure 116/79, pulse 77, temperature 97.6 ?F (36.4 ?C), resp. rate 20, height _0  (1.499 m), weight 62.2 kg, SpO2 96 %. ? ? ?General: No acute distress ?Mood and affect are appropriate ?Heart: Regular rate and rhythm no rubs murmurs or extra sounds ?Lungs: Clear to auscultation, breathing unlabored, no rales or wheezes ?Abdomen: Positive bowel sounds, soft nontender to palpation, nondistended ?Extremities: No clubbing, cyanosis, or edema ?Skin: No evidence of breakdown, no evidence of rash ? ? ?Skin: No evidence of breakdown, no evidence of rash ?Neurologic: Cranial nerves II through XII intact, motor strength is 4/5 in RIght and 5/5 left deltoid, bicep, tricep, grip, hip flexor, knee extensors, ankle dorsiflexor and plantar flexor ? ?Musculoskeletal: Full range of motion in all 4 extremities. No joint swelling ? ? ?Assessment/Plan: ?1. Functional deficits which require 3+ hours per day of interdisciplinary therapy in a comprehensive inpatient rehab setting. ?Physiatrist is providing close team supervision and 24 hour management of active medical problems listed below. ?Physiatrist and rehab team continue to assess barriers to discharge/monitor patient progress toward functional and medical goals ? ?Care Tool: ? ?Bathing ?   ?Body parts bathed by patient: Right arm, Left arm, Chest, Abdomen, Front  perineal area, Buttocks  ?   ?  ?  ?Bathing assist Assist Level: Contact Guard/Touching assist ?  ?  ?Upper Body Dressing/Undressing ?Upper body dressing   ?What is the patient wearing?: Pull over shirt ?   ?Upper body assist Assist Level: Set up assist ?   ?Lower Body Dressing/Undressing ?Lower body dressing ? ? ?   ?  ? ?  ? ?Lower body assist Assist for lower body dressing: Moderate Assistance - Patient 50 - 74% ?   ? ?Toileting ?Toileting    ?Toileting assist Assist for toileting: Contact Guard/Touching assist ?  ?  ?Transfers ?Chair/bed transfer ? ?Transfers assist ?   ? ?Chair/bed transfer assist level: Minimal Assistance - Patient > 75% ?  ?  ?Locomotion ?Ambulation ? ? ?Ambulation assist ? ?   ? ?Assist level: Minimal Assistance - Patient > 75% ?Assistive device: No Device ?Max distance: 161f  ? ?Walk 10 feet activity ? ? ?Assist ?   ? ?Assist level: Minimal Assistance - Patient > 75% ?Assistive device: Hand held assist  ? ?Walk 50 feet activity ? ? ?Assist   ? ?Assist level: Minimal Assistance - Patient > 75% ?Assistive device: Hand held assist  ? ? ?Walk 150 feet activity ? ? ?  Assist Walk 150 feet activity did not occur: Safety/medical concerns ? ?  ?  ?  ? ?Walk 10 feet on uneven surface  ?activity ? ? ?Assist   ? ? ?Assist level: Minimal Assistance - Patient > 75% ?Assistive device: Other (comment) (railing)  ? ?Wheelchair ? ? ? ? ?Assist Is the patient using a wheelchair?: Yes (only for transport) ?Type of Wheelchair: Manual ?  ? ?Wheelchair assist level: Dependent - Patient 0% ?   ? ? ?Wheelchair 50 feet with 2 turns activity ? ? ? ?Assist ? ?  ?  ? ? ?Assist Level: Dependent - Patient 0%  ? ?Wheelchair 150 feet activity  ? ? ? ?Assist ?   ? ? ?Assist Level: Dependent - Patient 0%  ? ?Blood pressure 116/79, pulse 77, temperature 97.6 ?F (36.4 ?C), resp. rate 20, height _0  (1.499 m), weight 62.2 kg, SpO2 96 %. ? ?Medical Problem List and Plan: ?1. 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            -patient may shower ?            -ELOS/Goals: 10-14 days S, Team conference today please see physician documentation under team conference tab, met with team  to discuss problems,progress, and goals. Formulized individual treatment plan based on medical history, underlying problem and comorbidities. ? ?        -Continue CIR therapies including PT, OT, and SLP   ?2.  Antithrombotics: ?-DVT/anticoagulation:  Mechanical:  Antiembolism stockings, knee (TED hose) Bilateral lower extremities ?            -antiplatelet therapy: none ?3. Pain Management: Tylenol as needed ?4. Mood: LCSW to evaluate and provide emotional support ?            -antipsychotic agents: n/a ?5. Neuropsych: This patient is not capable of making decisions on her own behalf. ?6. Skin/Wound Care:  local care ?7. Dysphagia: provide assistance with set up of meals ?            --dysphagia 3 diet, thin liquids---tolerating ?8: Right eye blindness: chronic ?9: DM2: Continue CBGs QID and SSI>>controlled ?            --on Jardiance and metformin at home ?            5/7-increase metformin to 562m bid. On 10054mbid at home ?CBG (last 3)  ?Recent Labs  ?  03/24/22 ?1642 03/24/22 ?2048 03/25/22 ?[id number]?GLUCAP 166* 180* 128*  ? ?Controlled 5/10 ? ?10: Hypertension: closely monitor with goal sys BP < 160 ?Vitals:  ? 03/24/22 1932 03/25/22 0412  ?BP: 137/84 116/79  ?Pulse: 80 77  ?Resp: 20 20  ?Temp: 97.8 ?F (36.6 ?C) 97.6 ?F (36.4 ?C)  ?SpO2: 95% 96%  ? Controlled 5/10 ? ?11: Hyperlipidemia: statin on hold due to IPH ?            --home atorvastatin 40 mg (neuro rec: increasing dose at discharge) ?12: Primary open-angle glaucoma: continue Timoptic ?13: Overweight: BMI 27.7; dietary counseling  ?  ? ?LOS: ?7 days ?A FACE TO FACE EVALUATION WAS PERFORMED ? ?AnLuanna Salkirsteins ?03/25/2022, 9:32 AM  ? ? ? ?

## 2022-03-25 NOTE — Progress Notes (Signed)
Speech Language Pathology Daily Session Note ? ?Patient Details  ?Name: Paula Combs ?MRN: [id number] ?Date of Birth: 12-01-1960 ? ?Today's Date: 03/25/2022 ?SLP Individual Time: 0263-7858 ?SLP Individual Time Calculation (min): 57 min ? ?Short Term Goals: ?Week 1: SLP Short Term Goal 1 (Week 1): Pt will consume current diet textures (Dysphagia 3 with thin liquids) with Min A for implementation of aspiration precautions and no evidence of aspiration. ?SLP Short Term Goal 2 (Week 1): Pt will follow one-step commands within functional and therapeutic tasks with 80% accuracy given Mod A. ?SLP Short Term Goal 3 (Week 1): Pt will name at least 10 items during simple convergent and divergent naming tasks given Mod A. ?SLP Short Term Goal 4 (Week 1): Pt will demonstrate improved intellectual awareness of situation and current deficits by naming reason for admission, 2 physical, and 2 communication deficits given Mod A. ?SLP Short Term Goal 5 (Week 1): Pt will sustain attention to task for 10 minutes with Min-Mod A. ? ?Skilled Therapeutic Interventions:Skilled ST services focused on swallow and language skills. Pt demonstrated ability to respond to yes/no questions pertaining to wants/needs with tray set up, responded to 40% of questions. SLP facilitated PO consumption of breakfast tray, dys 3 textures and thin liquids via straw. Pt required set up assist and mod A fade to min A verbal cues for use of liquid wash to clear moderate oral residue. No overt s/s aspiration. Pt requested to use the bathroom. SLP provided supervision A ambulating with RW, pt was impulsive. SLP provided cues for problem solving x1 during  toileting with cues to use soap during hand washing. SLP attempted to focus on language skills in verbal description task given picture cards, however pt expressed blurry vision and glasses are not present in hospital. SLP provided reader glasses available to use on CIR, pt demonstrated ability to respond to yes/no  questions pertaining to function of provided prescriptions in 80% of attempts. Pt stated that vision continues to be blurry with offered glasses but vision appeared to be baseline. Pt was left in room with call bell within reach and bed alarm set. SLP recommends to continue skilled services. ?   ? ?Pain ?Pain Assessment ?Pain Scale: 0-10 ?Pain Score: 0-No pain ? ?Therapy/Group: Individual Therapy ? ?Glennys Schorsch ?03/25/2022, 1:55 PM ?

## 2022-03-25 NOTE — Patient Care Conference (Signed)
Inpatient RehabilitationTeam Conference and Plan of Care Update ?Date: 03/25/2022   Time: 10:55 AM  ? ? ?Patient Name: Paula Combs      ?Medical Record Number: [id number]  ?Date of Birth: 1961-04-17 ?Sex: Female         ?Room/Bed: 0N02V/2Z36U-44 ?Payor Info: Payor: [place] MEDICAID PREPAID HEALTH PLAN / Plan: [place] MEDICAID HEALTHY BLUE / Product Type: *No Product type* /   ? ?Admit Date/Time:  03/18/2022  5:05 PM ? ?Primary Diagnosis:  ICH (intracerebral hemorrhage) (HCC) ? ?Hospital Problems: Principal Problem: ?  ICH (intracerebral hemorrhage) (HCC) ? ? ? ?Expected Discharge Date: Expected Discharge Date: 03/28/22 ? ?Team Members Present: ?Physician leading conference: Dr. Claudette Laws ?Social Worker Present: Lavera Guise, BSW ?Nurse Present: Chana Bode, RN ?PT Present: Casimiro Needle, PT ?OT Present: Barron Schmid, COTA;Jennifer Maurice, OT ?SLP Present: Eilene Ghazi, SLP ?PPS Coordinator present : Fae Pippin, SLP ? ?   Current Status/Progress Goal Weekly Team Focus  ?Bowel/Bladder ? ? Continent with some incontinent bladder episodes. LBM 5/8  Reduce incontinent episodes.  Continue bladder program   ?Swallow/Nutrition/ Hydration ? ? dysphagia 3 diet with thin liquids, min A  min A  tolerance of current diet with implementation of safe swallow strategies   ?ADL's ? ? pt completes bathing with CGA from sink level, set- up for UB dressing, MOD A for LB dressing, CGA for 3/3 toileting tasks, MINA for tranfsers with Rw. limited by aphasia but interpreters seem to understand her better, family ed soon?  supervision  family ed, activity tolerance, BADL reeducation,   ?Mobility ? ? supervision bed mobility, CGA/min assist sit<>stand and stand pivot transfers, min assist gait up to 172ft using RW vs no UE support, min assist 8 steps using B HRs - pt with R inattnetion and R hemiparesis impacting functional mobility  supervision overall at ambulatory level  activity tolerance, pt education, transfer training, R  hemibody NMR, dynamic standing balance, dynamic gait training, stair navigation training   ?Communication ? ? max A  mod A  functional expression, word finding, following basic commands   ?Safety/Cognition/ Behavioral Observations ? min-to-mod A - difficult to assess d/t aphasia and language differences  min A  sustained attention, awareness of deficits, orientation   ?Pain ? ? No c/o pain  Pain </=3/10  Assess Qshift and prn   ?Skin ? ? Skin intact  Maintain skin integrity  Assess qshift and prn   ? ? ?Discharge Planning:  ?Discharging home with SIL and daughter. Daughter remain in the home and able to provide 24/7 supervision. SIL speaks [place].   ?Team Discussion: ?Patient with right inattention, depth perception deficits, poor problem solving, poor safety awareness and aphasia post ICH. Also note perseveration,paraphasias and she struggles with open ended questions.  ? ?Patient on target to meet rehab goals: ?Currently needs CGA for sit - stand and stand pivot transfers. Needs min - mod assist for lower body care and CGA for toileting. Requires min assist for steps. Min assist for safe swallowing and following strategies. Needs max assist for word finding but appears to have more insight into deficits than previously noted. Goals for discharge set for supervision overall.  ? ?*See Care Plan and progress notes for long and short-term goals.  ? ?Revisions to Treatment Plan:  ?N/A ?  ?Teaching Needs: ?Safety, swallowing strategies, medications, secondary risk management, etc  ?Current Barriers to Discharge: ?Decreased caregiver support and Home enviroment access/layout ? ?Possible Resolutions to Barriers: ?Family education ?HH follow up services ?  DME: RW, TTB ?  ? ? Medical Summary ?Current Status: BP controlled, Right HP improving ? Barriers to Discharge: Medical stability ?  ?Possible Resolutions to [employer] Focus: family Careers information officer ? ? ?Continued Need for Acute Rehabilitation Level of Care: The  patient requires daily medical management by a physician with specialized training in physical medicine and rehabilitation for the following reasons: ?Direction of a multidisciplinary physical rehabilitation program to maximize functional independence : Yes ?Medical management of patient stability for increased activity during participation in an intensive rehabilitation regime.: Yes ?Analysis of laboratory values and/or radiology reports with any subsequent need for medication adjustment and/or medical intervention. : Yes ? ? ?I attest that I was present, lead the team conference, and concur with the assessment and plan of the team. ? ? ?Chana Bode B ?03/25/2022, 5:10 PM  ? ? ? ? ? ? ?

## 2022-03-26 DIAGNOSIS — E119 Type 2 diabetes mellitus without complications: Secondary | ICD-10-CM

## 2022-03-26 DIAGNOSIS — E785 Hyperlipidemia, unspecified: Secondary | ICD-10-CM

## 2022-03-26 LAB — GLUCOSE, CAPILLARY
Glucose-Capillary: 133 mg/dL — ABNORMAL HIGH (ref 70–99)
Glucose-Capillary: 138 mg/dL — ABNORMAL HIGH (ref 70–99)
Glucose-Capillary: 106 mg/dL — ABNORMAL HIGH (ref 70–99)
Glucose-Capillary: 133 mg/dL — ABNORMAL HIGH (ref 70–99)

## 2022-03-26 NOTE — Progress Notes (Signed)
Patient ID: Paula Combs, female   DOB: August 27, 1961, 61 y.o.   MRN: [id number] ? ?Rolling Walker and TTB ordered through [facility]  ?

## 2022-03-26 NOTE — Progress Notes (Signed)
Patient ID: Paula Combs, female   DOB: 1961-08-22, 61 y.o.   MRN: [id number] ? ?Team Conference Report to Patient/Family ? ?Team Conference discussion was reviewed with the patient and caregiver, including goals, any changes in plan of care and target discharge date.  Patient and caregiver express understanding and are in agreement.  The patient has a target discharge date of 03/28/22. ? ?Sw spoke with patient SIL and provided team conference updates. SIL will be present for family education tomorrow 1-4 PM.  ? ?Andria Rhein ?03/26/2022, 9:54 AM  ?

## 2022-03-26 NOTE — Progress Notes (Signed)
Patient ID: Paula Combs, female   DOB: 07-May-1961, 61 y.o.   MRN: [id number] ? ?[facility] referral sent to [facility]  ?

## 2022-03-26 NOTE — Discharge Instructions (Addendum)
Inpatient Rehab Discharge Instructions ? ?Paula Combs ?Discharge date and time: 03/28/2022  ? ?Activities/Precautions/ Functional Status: ?Activity: no lifting, driving, or strenuous exercise for until cleared by MD ?Diet: diabetic diet; well cooked foods, no raw vegetable or firm fruits; cut into bite sized pieces ?Wound Care: none needed ?Functional status:  ?___ No restrictions     ___ Walk up steps independently ?_x__ 24/7 supervision/assistance   ___ Walk up steps with assistance ?___ Intermittent supervision/assistance  ___ Bathe/dress independently ?___ Walk with walker     ___ Bathe/dress with assistance ?___ Walk Independently    ___ Shower independently ?___ Walk with assistance    __x_ Shower with assistance ?_x__ No alcohol     ___ Return to work/school ________ ? ?COMMUNITY REFERRALS UPON DISCHARGE:   ? ?Home Health:   PT     OT     ST    ?               Agency: [facility]     Phone: [phone] ?*Please expect follow-up within 2-3 days to schedule your home visit. If you have not received follow-up, be sure to contact branch directly.* ? ?Medical Equipment/Items Ordered: rolling walker and tub transfer bench ?                                                Agency/Supplier: [facility] [phone] ? ? ? ? ?Special Instructions: ? ?No driving, alcohol consumption or tobacco use.  ? ?STROKE/TIA DISCHARGE INSTRUCTIONS ?SMOKING Cigarette smoking nearly doubles your risk of having a stroke & is the single most alterable risk factor  ?If you smoke or have smoked in the last 12 months, you are advised to quit smoking for your health. Most of the excess cardiovascular risk related to smoking disappears within a year of stopping. ?Ask you doctor about anti-smoking medications ? Quit Line: 1-800-QUIT NOW ?Free Smoking Cessation Classes [phone]  ?CHOLESTEROL Know your levels; limit fat & cholesterol in your diet  ?Lipid Panel  ?   ?Component Value Date/Time  ? CHOL 246 (H) 03/15/2022 0140   ? TRIG 113 03/17/2022 0537  ? HDL 38 (L) 03/15/2022 0140  ? CHOLHDL 6.5 03/15/2022 0140  ? VLDL 55 (H) 03/15/2022 0140  ? LDLCALC 153 (H) 03/15/2022 0140  ? ? ? Many patients benefit from treatment even if their cholesterol is at goal. ?Goal: Total Cholesterol (CHOL) less than 160 ?Goal:  Triglycerides (TRIG) less than 150 ?Goal:  HDL greater than 40 ?Goal:  LDL (LDLCALC) less than 100 ?  ?BLOOD PRESSURE American Stroke Association blood pressure target is less that 120/80 mm/Hg  ?Your discharge blood pressure is:  BP: 111/83 Monitor your blood pressure ?Limit your salt and alcohol intake ?Many individuals will require more than one medication for high blood pressure  ?DIABETES (A1c is a blood sugar average for last 3 months) Goal HGBA1c is under 7% (HBGA1c is blood sugar average for last 3 months)  ?Diabetes: ?Diagnosis of diabetes:  Your A1c:5.7 %   ? ?Lab Results  ?Component Value Date  ? HGBA1C 5.7 (H) 03/14/2022  ? ? Your HGBA1c can be lowered with medications, healthy diet, and exercise. ?Check your blood sugar as directed by your physician ?Call your physician if you experience unexplained or low blood sugars.  ?PHYSICAL ACTIVITY/REHABILITATION Goal is 30 minutes at least  4 days per week  ?Activity: Increase activity slowly, ?Therapies: Physical Therapy: Home Health, Occupational Therapy: Home Health, and Speech Therapy: Home Health ?Return to work: n/a Activity decreases your risk of heart attack and stroke and makes your heart stronger.  It helps control your weight and blood pressure; helps you relax and can improve your mood. ?Participate in a regular exercise program. ?Talk with your doctor about the best form of exercise for you (dancing, walking, swimming, cycling).  ?DIET/WEIGHT Goal is to maintain a healthy weight  ?Your discharge diet is:  ?Diet Order   ? ?       ?  DIET DYS 3 Room service appropriate? Yes; Fluid consistency: Thin  Diet effective now       ?  ? ?  ?  ? ?  ? thin liquids ?Your  height is:  Height: 4\' 11"  (149.9 cm) ?Your current weight is: Weight: 62.2 kg ?Your Body Mass Index (BMI) is:  BMI (Calculated): 27.68 Following the type of diet specifically designed for you will help prevent another stroke. ?Your goal weight range is:   ?Your goal Body Mass Index (BMI) is 19-24. ?Healthy food habits can help reduce 3 risk factors for stroke:  High cholesterol, hypertension, and excess weight.  ?RESOURCES Stroke/Support Group:  Call [phone] ?  ?STROKE EDUCATION PROVIDED/REVIEWED AND GIVEN TO PATIENT Stroke warning signs and symptoms ?How to activate emergency medical system (call 911). ?Medications prescribed at discharge. ?Need for follow-up after discharge. ?Personal risk factors for stroke. ?Pneumonia vaccine given: No ?Flu vaccine given: No ?My questions have been answered, the writing is legible, and I understand these instructions.  I will adhere to these goals & educational materials that have been provided to me after my discharge from the hospital.  ? ?  ? ?My questions have been answered and I understand these instructions. I will adhere to these goals and the provided educational materials after my discharge from the hospital. ? ?Patient/Caregiver Signature _______________________________ Date __________ ? ?Clinician Signature _______________________________________ Date __________ ? ?Please bring this form and your medication list with you to all your follow-up doctor's appointments.   ?

## 2022-03-26 NOTE — Progress Notes (Signed)
?                                                       PROGRESS NOTE ? ? ?Subjective/Complaints: ?  ?No concerns or complaints this am. Spoke with video interpreter.  ? ?ROS: limited due to language/communication  ?No CP, SOB, N/V/D/C ? ?Objective: ?  ?No results found. ?Recent Labs  ?  03/25/22 ?1941  ?WBC 7.2  ?HGB 12.5  ?HCT 38.1  ?PLT 327  ? ? ? ?Recent Labs  ?  03/25/22 ?7408  ?NA 138  ?K 4.0  ?CL 108  ?CO2 22  ?GLUCOSE 118*  ?BUN 23  ?CREATININE 0.94  ?CALCIUM 9.5  ? ? ? ? ?Intake/Output Summary (Last 24 hours) at 03/26/2022 0837 ?Last data filed at 03/26/2022 1448 ?Gross per 24 hour  ?Intake 600 ml  ?Output --  ?Net 600 ml  ? ?  ? ?  ? ?Physical Exam: ?Vital Signs ?Blood pressure 111/75, pulse 80, temperature 98.1 ?F (36.7 ?C), resp. rate 18, height 4\' 11"  (1.499 m), weight 62.2 kg, SpO2 93 %. ? ? ?General: No acute distress ?Mood and affect are appropriate ?Heart: Regular rate and rhythm no rubs murmurs or extra sounds ?Lungs: Clear to auscultation, breathing unlabored, no rales or wheezes ?Abdomen: Positive bowel sounds, soft nontender to palpation, nondistended ?Extremities: No clubbing, cyanosis, or edema ?Skin: Warm and dry, No evidence of breakdown, no evidence of rash ? ? ?Skin: No evidence of breakdown, no evidence of rash ?Neurologic: Cranial nerves II through XII grossly intact, motor strength is 4/5 in RIght and 5/5 left deltoid, bicep, tricep, grip, hip flexor, knee extensors, ankle dorsiflexor and plantar flexor ? ?Musculoskeletal: Full range of motion in all 4 extremities. No joint swelling ? ? ?Assessment/Plan: ?1. Functional deficits which require 3+ hours per day of interdisciplinary therapy in a comprehensive inpatient rehab setting. ?Physiatrist is providing close team supervision and 24 hour management of active medical problems listed below. ?Physiatrist and rehab team continue to assess barriers to discharge/monitor patient progress toward functional and medical goals ? ?Care  Tool: ? ?Bathing ?   ?Body parts bathed by patient: Right arm, Left arm, Chest, Abdomen, Front perineal area, Buttocks  ?   ?  ?  ?Bathing assist Assist Level: Contact Guard/Touching assist ?  ?  ?Upper Body Dressing/Undressing ?Upper body dressing   ?What is the patient wearing?: Pull over shirt ?   ?Upper body assist Assist Level: Set up assist ?   ?Lower Body Dressing/Undressing ?Lower body dressing ? ? ?   ?  ? ?  ? ?Lower body assist Assist for lower body dressing: Moderate Assistance - Patient 50 - 74% ?   ? ?Toileting ?Toileting    ?Toileting assist Assist for toileting: Contact Guard/Touching assist ?  ?  ?Transfers ?Chair/bed transfer ? ?Transfers assist ?   ? ?Chair/bed transfer assist level: Contact Guard/Touching assist ?Chair/bed transfer assistive device: Walker ?  ?Locomotion ?Ambulation ? ? ?Ambulation assist ? ?   ? ?Assist level: Contact Guard/Touching assist ?Assistive device: Walker-rolling ?Max distance: 137ft  ? ?Walk 10 feet activity ? ? ?Assist ?   ? ?Assist level: Minimal Assistance - Patient > 75% ?Assistive device: Hand held assist  ? ?Walk 50 feet activity ? ? ?Assist   ? ?Assist level: Minimal Assistance - Patient > 75% ?  Assistive device: Hand held assist  ? ? ?Walk 150 feet activity ? ? ?Assist Walk 150 feet activity did not occur: Safety/medical concerns ? ?  ?  ?  ? ?Walk 10 feet on uneven surface  ?activity ? ? ?Assist   ? ? ?Assist level: Minimal Assistance - Patient > 75% ?Assistive device: Other (comment) (railing)  ? ?Wheelchair ? ? ? ? ?Assist Is the patient using a wheelchair?: Yes (only for transport) ?Type of Wheelchair: Manual ?  ? ?Wheelchair assist level: Dependent - Patient 0% ?   ? ? ?Wheelchair 50 feet with 2 turns activity ? ? ? ?Assist ? ?  ?  ? ? ?Assist Level: Dependent - Patient 0%  ? ?Wheelchair 150 feet activity  ? ? ? ?Assist ?   ? ? ?Assist Level: Dependent - Patient 0%  ? ?Blood pressure 111/75, pulse 80, temperature 98.1 ?F (36.7 ?C), resp. rate 18, height 4'  11" (1.499 m), weight 62.2 kg, SpO2 93 %. ? ?Medical Problem List and Plan: ?1. 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            -patient may shower ?            -ELOS/Goals: DC day 5/13 expected, goal mod A communication, min a safety/cog, min A swallowing ?        -Continue CIR therapies including PT, OT, and SLP   ?2.  Antithrombotics: ?-DVT/anticoagulation:  Mechanical:  Antiembolism stockings, knee (TED hose) Bilateral lower extremities ?            -antiplatelet therapy: none ?3. Pain Management: Tylenol as needed ?4. Mood: LCSW to evaluate and provide emotional support ?            -antipsychotic agents: n/a ?5. Neuropsych: This patient is not capable of making decisions on her own behalf. ?6. Skin/Wound Care:  local care ?7. Dysphagia: provide assistance with set up of meals ?            --dysphagia 3 diet, thin liquids---tolerating ?8: Right eye blindness: chronic ?9: DM2: Continue CBGs QID and SSI>>controlled ?            --on Jardiance and metformin at home ?            5/7-increase metformin to 500mg  bid. On 1000mg  bid at home ?CBG (last 3)  ?Recent Labs  ?  03/25/22 ?1622 03/25/22 ?2128 03/26/22 ?2129  ?GLUCAP 129* 102* 133*  ? ?CBG well controlled, continue metformin, continue healthy diet ? ?10: Hypertension: closely monitor with goal sys BP < 160 ?Vitals:  ? 03/25/22 2053 03/26/22 0636  ?BP: 126/80 111/75  ?Pulse: 81 80  ?Resp: 18 18  ?Temp: 98 ?F (36.7 ?C) 98.1 ?F (36.7 ?C)  ?SpO2: 97% 93%  ? CBG well controlled 5/11, continue to follow ? ?11: Hyperlipidemia: statin on hold due to IPH, discussed healthy diet ?            --home atorvastatin 40 mg (neuro rec: increasing dose at discharge) ?12: Primary open-angle glaucoma: continue Timoptic ?13: Overweight: BMI 27.7; dietary counseling  ?  ? ?LOS: ?8 days ?A FACE TO FACE EVALUATION WAS PERFORMED ? ?05/26/22 ?03/26/2022, 8:37 AM  ? ? ? ?

## 2022-03-26 NOTE — Progress Notes (Signed)
Patient ID: Paula Combs, female   DOB: 25-Mar-1961, 61 y.o.   MRN: [id number] ? ?Family education scheduled on Friday 5/12 1-4 PM with patient SIL.  ?

## 2022-03-26 NOTE — Progress Notes (Signed)
Physical Therapy Session Note ? ?Patient Details  ?Name: Paula Combs ?MRN: [id number] ?Date of Birth: [birth year] ? ?Today's Date: 03/26/2022 ?PT Individual Time: 0350-0938 ?PT Individual Time Calculation (min): 74 min  ? ?Short Term Goals: ?Week 1:  PT Short Term Goal 1 (Week 1): Pt will perform supine<>sit with supervision ?PT Short Term Goal 2 (Week 1): Pt will perform sit<>stands using LRAD with CGA ?PT Short Term Goal 3 (Week 1): Pt will perform bed<>chair transfers using LRAD with CGA ?PT Short Term Goal 4 (Week 1): Pt will ambulate at least 162ft using LRAD with CGA ?PT Short Term Goal 5 (Week 1): Pt will ascend/descend 4 steps using HRs with CGA ? ?Skilled Therapeutic Interventions/Progress Updates:  ?  Pt received in bed trying to scoot towards EOB around the bedrail, pt had not used call button and upon therapist arrival pt able to say "bathroom." Therapist requested for pt to wait for the RW and gait belt, which pt was able to understand and follow. [employer] Video interpreter used Peggye Pitt [phone]) Sit<>stands using RW with CGA/close supervision throughout session. Gait in/out bathroom using RW with CGA for steadying/safety. Standing with CGA for safety while pt completed LB clothing management with only intermittent min assist for R side - continent of bladder and bowels - performed peri-care with set-up assist for washcloths. Standing hand hygiene at sink with close supervision - pt demos accurate placement of AD at counter without cuing. ? ?Pt suddenly starts to cry when therapist mentioned planned D/C in 2 days. Sat on EOB and discussed with pt and she verbalized she is not able to express her emotions but does say that she misses her grandchildren. ? ?Gait training ~114ft to main therapy gym using RW with CGA for steadying - pt contiues to push AD too far forward requring repeated cuing and facilitation for correction. ? ? ?Patient participated in [facility] and demonstrates increased fall risk  as noted by score of  30/56.  (<36= high risk for falls, close to 100%; 37-45 significant >80%; 46-51 moderate >50%; 52-55 lower >25%). ? ?Stair navigation training ascending/descending 4 (6" height) x3 using B HRs with CGA for steadying - reciprocal pattern on ascent with therapist cuing and facilitation for increased R glute activation, step-to pattern leading with L LE on descent.  ? ?Gait training ~161ft back to room using RW with CGA progressed to supervision with continued cuing though improved AD management not pushing it too far forward.  ? ?At end of session, pt left supine in bed with needs in reach and bed alarm on. ? ? ?Therapy Documentation ?Precautions:  ?Precautions ?Precautions: Fall, Other (comment) ?Precaution Comments: mild R hemiparesis, possible R inattention ?Restrictions ?Weight Bearing Restrictions: No ? ? ?Pain: ?Denies pain during session. ? ?Balance: ?Standardized Balance Assessment ?Standardized Balance Assessment: Sharlene Motts Balance Test ?Sharlene Motts Balance Test ?Sit to Stand: Able to stand  independently using hands ?Standing Unsupported: Able to stand 2 minutes with supervision ?Sitting with Back Unsupported but Feet Supported on Floor or Stool: Able to sit safely and securely 2 minutes ?Stand to Sit: Controls descent by using hands ?Transfers: Able to transfer with verbal cueing and /or supervision ?Standing Unsupported with Eyes Closed: Able to stand 10 seconds with supervision ?Standing Ubsupported with Feet Together: Able to place feet together independently and stand for 1 minute with supervision ?From Standing, Reach Forward with Outstretched Arm: Reaches forward but needs supervision ?From Standing Position, Pick up Object from Floor: Able to pick up shoe,  needs supervision ?From Standing Position, Turn to Look Behind Over each Shoulder: Needs supervision when turning ?Turn 360 Degrees: Needs close supervision or verbal cueing (turns very slowly and guarded but does not require  assistance) ?Standing Unsupported, Alternately Place Feet on Step/Stool: Able to complete >2 steps/needs minimal assist ?Standing Unsupported, One Foot in Front: Able to take small step independently and hold 30 seconds ?Standing on One Leg: Unable to try or needs assist to prevent fall ?Total Score: 30 ? ? ?Therapy/Group: Individual Therapy ? ?Ginny Forth , PT, DPT, NCS, CSRS ?03/26/2022, 7:54 AM  ?

## 2022-03-26 NOTE — Progress Notes (Signed)
Occupational Therapy Session Note ? ?Patient Details  ?Name: Paula Combs ?MRN: [id number] ?Date of Birth: 1961/10/08 ? ?Today's Date: 03/26/2022 ?OT Individual Time: 7096-2836 ?OT Individual Time Calculation (min): 72 min  ? ? ?Short Term Goals: ?Week 1:  OT Short Term Goal 1 (Week 1): Pt will complete toilet transfer with CGA ?OT Short Term Goal 1 - Progress (Week 1): Met ?OT Short Term Goal 2 (Week 1): Pt will complete LB dressing with min assist including brief, pants, and socks/shoes. ?OT Short Term Goal 2 - Progress (Week 1): Progressing toward goal ?OT Short Term Goal 3 (Week 1): Pt will complete tub shower transfer with CGA. ?OT Short Term Goal 3 - Progress (Week 1): Progressing toward goal ?OT Short Term Goal 4 (Week 1): Pt will complete grooming at sink in standing with supervision. ?OT Short Term Goal 4 - Progress (Week 1): Met ? ?Skilled Therapeutic Interventions/Progress Updates:  ?Pt greeted supine in bed  agreeable to OT intervention, utilized video interpreter during session. Session focus on BADL reeducation, functional mobility, dynamic standing balance and decreasing overall caregiver burden.     ?Discussed DC plan of pt [place] sartuday with plan for family to come in tomorrow for training, pt noted to become emotional but providing no explaination to change in mood even when prompting questions such as " did I say something to upset you?" "Are you worried about going home?" Pt did not respond to any of them.  ?Pt declined washing up at sink or a shower, pt completed LB dressing from bed level with MINA, supervision for bed mobility and CGA for stand pivot transfer from EOB>w/c with RW. Pt continues to be limited by aphasia stating she is tired of sitting up but then states she wants to get out of the bed and go to the gym.  ?Pt completed ambulatory toilet transfer to bathroom with Rw and CGA, pt completed 3/3 toileting tasks with CGA, noted to have incontinent BM, NT aware. MIN verbal cues for safety  related to cleanliness during pericare.  Pt transported to ADL apt where pt completed multiple stand pivot transfers to bed and couch with RW and CGA. Pt noted to have difficulty discerning R and L today during mobility cues needing tactile cues and repeated cues to follow commands related to directional cues. Pt completed functional ambulation in hallway with Rw and CGA > 50 ft. Pt needed seated rest break and then able to ambulate remainder of distance back to room. pt left supine in bed with bed alarm activated and all needs within reach.                     ? ? ?Therapy Documentation ?Precautions:  ?Precautions ?Precautions: Fall, Other (comment) ?Precaution Comments: mild R hemiparesis, possible R inattention ?Restrictions ?Weight Bearing Restrictions: No ? ?Pain: no pain reported  ? ? ? ?Therapy/Group: Individual Therapy ? ?Precious Haws ?03/26/2022, 12:04 PM ?

## 2022-03-26 NOTE — Progress Notes (Signed)
Speech Language Pathology Daily Session Note ? ?Patient Details  ?Name: Paula Combs ?MRN: [id number] ?Date of Birth: April 17, 1961 ? ?Today's Date: 03/26/2022 ?SLP Individual Time: 1100-1200 ?SLP Individual Time Calculation (min): 60 min ? ?Short Term Goals: ?Week 1: SLP Short Term Goal 1 (Week 1): Pt will consume current diet textures (Dysphagia 3 with thin liquids) with Min A for implementation of aspiration precautions and no evidence of aspiration. - Pt consumed Dysphagia 3 textures and thin liquids with Min A for implementation of aspiration precautions and no evidence of aspiration. ? ?SLP Short Term Goal 2 (Week 1): Pt will follow one-step commands within functional and therapeutic tasks with 80% accuracy given Mod A. - Followed simple one-step commands within functional task with 100% accuracy given Min A. ? ?SLP Short Term Goal 3 (Week 1): Pt will name at least 10 items during simple convergent and divergent naming tasks given Mod A. - Correctly named pictures of nouns on 9 out of 10 trials with Mod I; x 1 with Mod A.  Divergent naming of preferred foods: x 6 with overall Min-Mod semantic and phonemic cues. ? ?SLP Short Term Goal 4 (Week 1): Pt will demonstrate improved intellectual awareness of situation and current deficits by naming reason for admission, 2 physical, and 2 communication deficits given Mod A. - Named reason for admission independently.  Named 2 communication deficits with Min-Mod verbal A.  ? ?SLP Short Term Goal 5 (Week 1): Pt will sustain attention to task for 10 minutes with Min-Mod A. - Pt sustained attention to therapeutic naming tasks for 10 minutes with Min A.  ? ?Skilled Therapeutic Interventions: ?Pt seen this date for skilled ST intervention targeting dysphagia and communication goals outlined above. Pt encountered awake/alert and lying in bed. Transferred from bed to w/c with Min A (stand pivot with gait belt). Agreeable to ST intervention in therapy room with Mod A for communication.  [place] interpreter [name] [phone]) throughout. ? ?SLP facilitated today's session by providing the following skilled ST interventions within the context of functional and therapeutic tx tasks: naming hierarchy cueing system, corrective feedback, model prompting, tactile cueing for articulatory placement (particularly for velar consonants), multimodal A for improving verbal output and functional communication, as well as Min A for implementation of safe swallow strategies. 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  ? ?Remains stimulable for skilled interventions as evident by subtle improvement noted in pt's performance from session to session; continue to recommend ST intervention. Please see above for objective data re: pt's performance from today's session.  ? ?Pt left in w/c with safety belt donned, call bell reviewed and within reach, and all immediate needs met. Continue per current ST POC. ? ?Pain ?No pain reported this session; NAD noted. ? ?Therapy/Group: Individual Therapy ? ?Klyn Kroening A Juanya Villavicencio ?03/26/2022, 3:53 PM ?

## 2022-03-27 DIAGNOSIS — H4010X0 Unspecified open-angle glaucoma, stage unspecified: Secondary | ICD-10-CM

## 2022-03-27 DIAGNOSIS — R1310 Dysphagia, unspecified: Secondary | ICD-10-CM

## 2022-03-27 LAB — GLUCOSE, CAPILLARY
Glucose-Capillary: 128 mg/dL — ABNORMAL HIGH (ref 70–99)
Glucose-Capillary: 105 mg/dL — ABNORMAL HIGH (ref 70–99)
Glucose-Capillary: 88 mg/dL (ref 70–99)
Glucose-Capillary: 124 mg/dL — ABNORMAL HIGH (ref 70–99)

## 2022-03-27 MED ORDER — PANTOPRAZOLE SODIUM 40 MG PO TBEC: 40.0000 mg | DELAYED_RELEASE_TABLET | Freq: Every day | ORAL | 0 refills | Status: DC

## 2022-03-27 MED ORDER — ATORVASTATIN CALCIUM 80 MG PO TABS: 80.0000 mg | ORAL_TABLET | Freq: Every day | ORAL | 0 refills | Status: DC

## 2022-03-27 MED ORDER — TIMOLOL MALEATE 0.5 % OP SOLN: 1.0000 [drp] | Freq: Two times a day (BID) | OPHTHALMIC | 0 refills | Status: DC

## 2022-03-27 MED ORDER — METFORMIN HCL 850 MG PO TABS: 850.0000 mg | ORAL_TABLET | Freq: Two times a day (BID) | ORAL | 0 refills | Status: DC

## 2022-03-27 MED ORDER — ACETAMINOPHEN 325 MG PO TABS: 325.0000 mg | ORAL_TABLET | ORAL | Status: DC | PRN

## 2022-03-27 MED ORDER — METFORMIN HCL 850 MG PO TABS
850.0000 mg | ORAL_TABLET | Freq: Two times a day (BID) | ORAL | Status: DC
  Administered 2022-03-27 – 2022-03-28 (×2): 850 mg via ORAL
  Filled 2022-03-27 (×2): qty 1

## 2022-03-27 MED ORDER — IRBESARTAN 75 MG PO TABS: 75.0000 mg | ORAL_TABLET | Freq: Every day | ORAL | 0 refills | Status: DC

## 2022-03-27 NOTE — Progress Notes (Signed)
Occupational Therapy Session Note ? ?Patient Details  ?Name: Lacinda Curvin ?MRN: [id number] ?Date of Birth: [birth year] ? ?Short Term Goals: ?Week 1:  OT Short Term Goal 1 (Week 1): Pt will complete toilet transfer with CGA ?OT Short Term Goal 1 - Progress (Week 1): Met ?OT Short Term Goal 2 (Week 1): Pt will complete LB dressing with min assist including brief, pants, and socks/shoes. ?OT Short Term Goal 2 - Progress (Week 1): Progressing toward goal ?OT Short Term Goal 3 (Week 1): Pt will complete tub shower transfer with CGA. ?OT Short Term Goal 3 - Progress (Week 1): Progressing toward goal ?OT Short Term Goal 4 (Week 1): Pt will complete grooming at sink in standing with supervision. ?OT Short Term Goal 4 - Progress (Week 1): Met ? ?Skilled Therapeutic Interventions/Progress Updates:  ?  Pt received asleep in bed with SIL present for scheduled family education. , agreeable to therapy. Session focus on self-care retraining, activity tolerance, family education in prep for improved ADL/IADL/func mobility performance + decreased caregiver burden.  ? ?Educated SIL on rec for 24/7 S and CGA for all mobility with use of RW, verbalized understanding and confirmed availablility of 24/7 assist between himself and his wife. Reviewed pt current deficits and impact on safety, BADL/mobility performance (R inattention, mild R HP, balance deficits, aphasia, etc.). ? ?Pt came to sitting EOB with close S and increased time to attend to R side. Ambulated to and from ADL apartment with CGA overall due to pushing RW too far in front of her/brushing up against obstacles on R side. SIL able to facilitate ambulation with min cues from therapist for safety technique. ? ?Pt completed TTB transfer after visual demonstration with CGA and cues for sequencing. Rec pt remain seated for bathing as she has no grab bars at home.  ? ?Pt unable to state day of week, accurate month, or year when prompted (replied I don't know x2 and that the month was  March.) ? ?Pt left seated EOB with SIL present, awaiting following SLP fam ed session with bed alarm engaged, call bell in reach, and all immediate needs met. SIL/pt with no additional questions at this time.  ? ?*Unable to bill for session due to pt DC being signed prior, pt seen for scheduled family education with SIL prior to DC tomorrow.  ? ?Therapy Documentation ?Precautions:  ?Precautions ?Precautions: Fall, Other (comment) ?Precaution Comments: mild R hemiparesis, possible R inattention ?Restrictions ?Weight Bearing Restrictions: No ?Pain: no c/o througout  ?  ?ADL: See Care Tool for more details. ? ? ?Volanda Napoleon MS, OTR/L ? ?03/27/2022, 6:58 AM ?

## 2022-03-27 NOTE — Progress Notes (Signed)
Occupational Therapy Discharge Summary ? ?Patient Details  ?Name: Paula Combs ?MRN: [id number] ?Date of Birth: [birth year] ? ?Today's Date: 03/27/2022 ?OT Individual Time: 7517-0017 ?OT Individual Time Calculation (min): 74 min  ? ? ?Patient has met 7 of 7 long term goals due to improved balance and improved awareness.  Patient to discharge at overall Supervision level.  Patient's care partner is independent to provide the necessary physical assistance at discharge.   ? ?Continues to need close SPV/SBA with self care activities using DME as educated. ? ?Recommendation:  ?Patient will benefit from ongoing skilled OT services in home health setting to continue to advance functional skills in the area of BADL and iADL. ? ?Equipment: ?Patient is going home with a rolling walker and shower bench ? ?Reasons for discharge: treatment goals met and patient is motivated to return home ? ?Patient/family agrees with progress made and goals achieved: Yes. Used interpreter to discuss goals and progress and assistance required with patient. Patient reports having son and daughter to assist. ? ?OT Discharge ?Precautions/Restrictions Patient continues to need 24/7 supervision and assist with IADL's. ?  ? ?  ?Pain ?Pain Assessment ?Pain Scale: 0-10 ?Pain Score: 0-No pain ?ADL ?ADL ?Eating: Supervision/safety ?Where Assessed-Eating: Chair ?Grooming: Setup ?Where Assessed-Grooming: Standing at sink ?Upper Body Bathing: Supervision/safety ?Where Assessed-Upper Body Bathing: Shower ?Lower Body Bathing: Supervision/safety ?Where Assessed-Lower Body Bathing: Shower ?Upper Body Dressing: Setup ?Where Assessed-Upper Body Dressing: Wheelchair ?Lower Body Dressing: Contact guard ?Where Assessed-Lower Body Dressing: Wheelchair ?Toileting: Supervision/safety ?Where Assessed-Toileting: Toilet ?Toilet Transfer: Close supervision ?Toilet Transfer Method: Ambulating ?Science writer: Grab bars ?Walk-In Shower Transfer: Close  supervision ?Walk-In Shower Transfer Method: Ambulating ?Walk-In Shower Equipment: Grab bars, Shower seat with back ?Vision ?Baseline Vision/History: 2 Legally blind ? ?  ?Cognition- patient is able to answer basic questions through the use of an interpreter with increased time and cues to gain a complete answer. ?  ?  ?Motor- Patient displays grossly intact BUE strength with the left displaying more strength and endurance than the left. ?Mobility - Patient able to perform basic transfers utilizing rolling walker with close supervision. ?   ?Balance- Patient continues to require the assist of a device in standing for safety during ADL tasks and a walker for functional transfers ?  ?  ?  ? ? ?Hermina Barters ?03/27/2022, 11:42 AM ?

## 2022-03-27 NOTE — Plan of Care (Signed)
  Problem: RH SKIN INTEGRITY Goal: RH STG SKIN FREE OF INFECTION/BREAKDOWN Outcome: Progressing   

## 2022-03-27 NOTE — Progress Notes (Signed)
Occupational Therapy Session Note ? ?Patient Details  ?Name: Paula Combs ?MRN: [id number] ?Date of Birth: 09/16/1961 ? ?Today's Date: 03/27/2022 ?OT Individual Time: 1540-0867 ?OT Individual Time Calculation (min): 74 min  ? ? ?Short Term Goals: ?All STG's met. Patient also achieved LTG of supervision with basic ADL's ? ?Skilled Therapeutic Interventions/Progress Updates:  ?   ? ?Therapy Documentation ?Precautions:  ?Precautions ?Precautions: Fall, Other (comment) ?Precaution Comments: mild R hemiparesis, possible R inattention ?Restrictions ?Weight Bearing Restrictions: No ? ?  ?Pain: ?Pain Assessment ?Pain Scale: 0-10 ?Pain Score: 0-No pain ?ADL: ?ADL ?Eating: Supervision/safety ?Where Assessed-Eating: Chair ?Grooming: Setup ?Where Assessed-Grooming: Standing at sink ?Upper Body Bathing: Supervision/safety ?Where Assessed-Upper Body Bathing: Shower ?Lower Body Bathing: Supervision/safety ?Where Assessed-Lower Body Bathing: Shower ?Upper Body Dressing: Setup ?Where Assessed-Upper Body Dressing: Wheelchair ?Lower Body Dressing: Contact guard ?Where Assessed-Lower Body Dressing: Wheelchair ?Toileting: Supervision/safety ?Where Assessed-Toileting: Toilet ?Toilet Transfer: Close supervision ?Toilet Transfer Method: Ambulating ?Science writer: Grab bars ?Walk-In Shower Transfer: Close supervision ?Walk-In Shower Transfer Method: Ambulating ?Walk-In Shower Equipment: Grab bars, Shower seat with back ?Vision ?Baseline Vision/History: 2 Legally blind ? ?  ?Other Treatments:  Patient seen for self care assessment. Used phone interpreter to provide instructions and to cue patient on home safety. Following self care tasks educated patient on discharge to home including need for supervision and assist with medications and meal preparation. Patient educated on the need for an emergency plan to be sure she can safely exit the home if needed with assistance as needed and the need to be able to call for help if she will  spend any time at home alone. Patient was unsure of son and daughters schedule and if she would have any time alone or what obstacles she would need to manage to safely exit the home. Recommend HHOT to continue to address safety concerns. ? ? ?Therapy/Group: Individual Therapy ? ?Hermina Barters ?03/27/2022, 12:00 PM ?

## 2022-03-27 NOTE — Progress Notes (Signed)
Physical Therapy Session Note ? ?Patient Details  ?Name: [name] ?MRN: [id number] ?Date of Birth: Jun 10, 1961 ? ?Today's Date: 03/27/2022 ?PT Individual Time: 1000-1025 ?PT Individual Time Calculation (min): 25 min  ? ?Short Term Goals: ?Week 1:  PT Short Term Goal 1 (Week 1): Pt will perform supine<>sit with supervision ?PT Short Term Goal 2 (Week 1): Pt will perform sit<>stands using LRAD with CGA ?PT Short Term Goal 3 (Week 1): Pt will perform bed<>chair transfers using LRAD with CGA ?PT Short Term Goal 4 (Week 1): Pt will ambulate at least 167ft using LRAD with CGA ?PT Short Term Goal 5 (Week 1): Pt will ascend/descend 4 steps using HRs with CGA ?Week 2:    ? ?Skilled Therapeutic Interventions/Progress Updates:  ?  Patient received sitting up in wc, agreeable to PT. She denies pain. [employer] interpretor used, though there was some delay in getting an interpretor for patients specific language. She was agreeable to ambulate using RW. CGA to stand and ambulate 277ft. Patient noted to have slight possible R inattention requiring increased cues to avoid obstacles on that side. Patient returning to room, sitting up in wc, seatbelt alarm on, call light within reach.  ? ?Therapy Documentation ?Precautions:  ?Precautions ?Precautions: Fall, Other (comment) ?Precaution Comments: mild R hemiparesis, possible R inattention ?Restrictions ?Weight Bearing Restrictions: No ? ? ? ?Therapy/Group: Individual Therapy ? ?Westley Foots ?Westley Foots, PT, DPT, CBIS ? ?03/27/2022, 9:58 AM  ?

## 2022-03-27 NOTE — Progress Notes (Signed)
?                                                       PROGRESS NOTE ? ? ?Subjective/Complaints: ?No new complaints expressed this morning ?Working with therapy ?Family is coming in today for caregiver training  ? ?ROS: Limited due to language/communication  ?No CP, SOB, N/V/D/C ? ?Objective: ?  ?No results found. ?Recent Labs  ?  03/25/22 ?2536  ?WBC 7.2  ?HGB 12.5  ?HCT 38.1  ?PLT 327  ? ? ?Recent Labs  ?  03/25/22 ?6440  ?NA 138  ?K 4.0  ?CL 108  ?CO2 22  ?GLUCOSE 118*  ?BUN 23  ?CREATININE 0.94  ?CALCIUM 9.5  ? ? ? ?Intake/Output Summary (Last 24 hours) at 03/27/2022 1102 ?Last data filed at 03/27/2022 3474 ?Gross per 24 hour  ?Intake 418 ml  ?Output --  ?Net 418 ml  ?  ? ?  ? ?Physical Exam: ?Vital Signs ?Blood pressure 116/82, pulse 91, temperature 98.1 ?F (36.7 ?C), temperature source Oral, resp. rate 18, height 4\' 11"  (1.499 m), weight 62.2 kg, SpO2 95 %. ? ?Gen: no distress, normal appearing ?HEENT: oral mucosa pink and moist, NCAT ?Cardio: Reg rate ?Chest: normal effort, normal rate of breathing ?Abd: soft, non-distended ?Ext: no edema ?Psych: pleasant, normal affect ? ? ?Skin: No evidence of breakdown, no evidence of rash ?Neurologic: Cranial nerves II through XII grossly intact, motor strength is 4/5 in RIght and 5/5 left deltoid, bicep, tricep, grip, hip flexor, knee extensors, ankle dorsiflexor and plantar flexor ? ?Musculoskeletal: Full range of motion in all 4 extremities. No joint swelling ? ? ?Assessment/Plan: ?1. Functional deficits which require 3+ hours per day of interdisciplinary therapy in a comprehensive inpatient rehab setting. ?Physiatrist is providing close team supervision and 24 hour management of active medical problems listed below. ?Physiatrist and rehab team continue to assess barriers to discharge/monitor patient progress toward functional and medical goals ? ?Care Tool: ? ?Bathing ?   ?Body parts bathed by patient: Right arm, Left arm, Chest, Abdomen, Front perineal area, Buttocks   ?   ?  ?  ?Bathing assist Assist Level: Contact Guard/Touching assist ?  ?  ?Upper Body Dressing/Undressing ?Upper body dressing   ?What is the patient wearing?: Pull over shirt ?   ?Upper body assist Assist Level: Set up assist ?   ?Lower Body Dressing/Undressing ?Lower body dressing ? ? ?   ?  ? ?  ? ?Lower body assist Assist for lower body dressing: Minimal Assistance - Patient > 75% ?   ? ?Toileting ?Toileting    ?Toileting assist Assist for toileting: Contact Guard/Touching assist ?  ?  ?Transfers ?Chair/bed transfer ? ?Transfers assist ?   ? ?Chair/bed transfer assist level: Supervision/Verbal cueing ?Chair/bed transfer assistive device: Walker ?  ?Locomotion ?Ambulation ? ? ?Ambulation assist ? ?   ? ?Assist level: Contact Guard/Touching assist ?Assistive device: Walker-rolling ?Max distance: 115ft  ? ?Walk 10 feet activity ? ? ?Assist ?   ? ?Assist level: Minimal Assistance - Patient > 75% ?Assistive device: Hand held assist  ? ?Walk 50 feet activity ? ? ?Assist   ? ?Assist level: Minimal Assistance - Patient > 75% ?Assistive device: Hand held assist  ? ? ?Walk 150 feet activity ? ? ?Assist Walk 150 feet activity did not occur: Safety/medical  concerns ? ?  ?  ?  ? ?Walk 10 feet on uneven surface  ?activity ? ? ?Assist   ? ? ?Assist level: Minimal Assistance - Patient > 75% ?Assistive device: Other (comment) (railing)  ? ?Wheelchair ? ? ? ? ?Assist Is the patient using a wheelchair?: Yes (only for transport) ?Type of Wheelchair: Manual ?  ? ?Wheelchair assist level: Dependent - Patient 0% ?   ? ? ?Wheelchair 50 feet with 2 turns activity ? ? ? ?Assist ? ?  ?  ? ? ?Assist Level: Dependent - Patient 0%  ? ?Wheelchair 150 feet activity  ? ? ? ?Assist ?   ? ? ?Assist Level: Dependent - Patient 0%  ? ?Blood pressure 116/82, pulse 91, temperature 98.1 ?F (36.7 ?C), temperature source Oral, resp. rate 18, height 4\' 11"  (1.499 m), weight 62.2 kg, SpO2 95 %. ? ?Medical Problem List and Plan: ?1. 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            -patient may shower ?            -ELOS/Goals: DC day 5/13 expected, goal mod A communication, min a safety/cog, min A swallowing ?        -Continue CIR therapies including PT, OT, and SLP   ?2.  Antithrombotics: ?-DVT/anticoagulation:  Mechanical:  Antiembolism stockings, knee (TED hose) Bilateral lower extremities ?            -antiplatelet therapy: none ?3. Pain Management: Tylenol as needed ?4. Mood: LCSW to evaluate and provide emotional support ?            -antipsychotic agents: n/a ?5. Neuropsych: This patient is not capable of making decisions on her own behalf. ?6. Skin/Wound Care:  local care ?7. Dysphagia: provide assistance with set up of meals ?            --continue dysphagia 3 diet, thin liquids---tolerating ?8: Right eye blindness: chronic ?9: DM2: Continue CBGs QID and SSI>>controlled ?            --on Jardiance and metformin at home ?            5/7-increase metformin to 500mg  bid. On 1000mg  bid at home ?CBG (last 3)  ?Recent Labs  ?  03/26/22 ?1654 03/26/22 ?2114 03/27/22 ?05/26/22  ?GLUCAP 106* 133* 128*  ?CBG well controlled, continue metformin, continue healthy diet ? 5/12: increase metformin to 850mg  BID ? ?10: Hypertension: closely monitor with goal sys BP < 160 ?Vitals:  ? 03/26/22 2027 03/27/22 0540  ?BP: 122/80 116/82  ?Pulse: 85 91  ?Resp: 17 18  ?Temp: 98.5 ?F (36.9 ?C) 98.1 ?F (36.7 ?C)  ?SpO2: 97% 95%  ? ? ?11: Hyperlipidemia: statin on hold due to IPH, discussed healthy diet ?            --home atorvastatin 40 mg (neuro rec: increasing dose at discharge) ?12: Primary open-angle glaucoma: continue Timoptic ?13: Overweight: BMI 27.7; dietary counseling  ?  ? ?LOS: ?9 days ?A FACE TO FACE EVALUATION WAS PERFORMED ? ? P Samari Bittinger ?03/27/2022, 11:02 AM  ? ? ? ?

## 2022-03-27 NOTE — Progress Notes (Signed)
Physical Therapy Discharge Summary ? ?Patient Details  ?Name: Paula Combs ?MRN: [id number] ?Date of Birth: 1961-10-09 ? ?Today's Date: 03/27/2022 ? ? ? ?Patient has met 9 of 9 long term goals due to improved activity tolerance, improved balance, improved postural control, increased strength, ability to compensate for deficits, functional use of  right upper extremity and right lower extremity, improved attention, and improved awareness.  Patient to discharge at an ambulatory level Supervision using RW.  Patient's care partner is independent to provide the necessary physical and cognitive assistance at discharge. ? ?Reasons goals not met: n/a ? ?Recommendation:  ?Patient will benefit from ongoing skilled PT services in home health setting to continue to advance safe functional mobility, address ongoing impairments in dynamic standing balance, dynamic gait training with LRAD, and minimize fall risk. ? ?Equipment: ?RW ? ?Reasons for discharge: treatment goals met and discharge from hospital ? ?Patient/family agrees with progress made and goals achieved: Yes ? ?PT Discharge ?Precautions/Restrictions ?Precautions ?Precautions: Fall;Other (comment) ?Precaution Comments: mild R hemiparesis, R inattention ?Restrictions ?Weight Bearing Restrictions: No ?Pain ?Pain Assessment ?Pain Scale: 0-10 ?Pain Score: 0-No pain ?Pain Interference ?Pain Interference ?Pain Effect on Sleep: 8. 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 Unable to answer ?Pain Interference with Day-to-Day Activities: 8. Unable to answer ?Vision/Perception  ?Vision - History ?Ability to See in Adequate Light: 1 Impaired ?Perception ?Perception: Impaired ?Inattention/Neglect: Other (comment) (mild R inattention) ?Praxis ?Praxis: Intact  ?Cognition   ?Overall Cognitive Status: Difficult to assess (due to aphasia) ?Arousal/Alertness: Awake/alert ?Orientation Level: Oriented to  person;Oriented to place;Oriented to situation;Disoriented to time ?Attention: Focused;Sustained ?Focused Attention: Appears intact ?Sustained Attention: Appears intact ?Memory: Impaired ?Awareness: Impaired ?Safety/Judgment: Impaired ?Sensation  ?Sensation ?Light Touch: Appears Intact ?Hot/Cold: Not tested ?Proprioception: Appears Intact ?Stereognosis: Not tested ?Coordination ?Gross Motor Movements are Fluid and Coordinated: Yes ?Fine Motor Movements are Fluid and Coordinated: No ?Coordination and Movement Description: GM movements are WFL ?Heel Shin Test: WFL on LLE; extra time and decreased accuracy on RLE ?Motor  ?Motor ?Motor: Other (comment) ?Motor - Discharge Observations: mild R hemiparesis though improved since initial eval  ?Mobility ?Bed Mobility ?Bed Mobility: Supine to Sit;Sit to Supine ?Supine to Sit: Supervision/Verbal cueing ?Sit to Supine: Supervision/Verbal cueing ?Transfers ?Transfers: Sit to Stand;Stand to [employer] Transfers ?Sit to Stand: Supervision/Verbal cueing ?Stand to Sit: Supervision/Verbal cueing ?Stand Pivot Transfers: Supervision/Verbal cueing ?Stand Pivot Transfer Details: Verbal cues for safe use of DME/AE;Verbal cues for sequencing;Verbal cues for technique;Verbal cues for precautions/safety ?Transfer (Assistive device): Rolling walker ?Locomotion  ?Gait ?Ambulation: Yes ?Gait Assistance: Supervision/Verbal cueing ?Gait Distance (Feet): 150 Feet ?Assistive device: Rolling walker ?Gait Assistance Details: Verbal cues for safe use of DME/AE;Verbal cues for gait pattern;Verbal cues for precautions/safety ?Gait ?Gait: Yes ?Gait Pattern: Impaired ?Gait Pattern:  (anterior trunk lean pushing AD too far forward) ?Gait velocity: decreased ?Stairs / Additional Locomotion ?Stairs: Yes ?Stairs Assistance: Supervision/Verbal cueing ?Stair Management Technique: Two rails;Alternating pattern;Step to pattern;Forwards ?Number of Stairs: 12 ?Height of Stairs: 6 ?Ramp: Contact Guard/touching  assist ?Curb: Contact Guard/Touching assist (using RW) ?Wheelchair Mobility ?Wheelchair Mobility: No  ?Trunk/Postural Assessment  ?Cervical Assessment ?Cervical Assessment: Within Functional Limits ?Thoracic Assessment ?Thoracic Assessment: Within Functional Limits ?Lumbar Assessment ?Lumbar Assessment: Within Functional Limits ?Postural Control ?Postural Control: Within Functional Limits (using RW)  ?Balance ?Balance ?Balance Assessed: Yes ?Standardized Balance Assessment ?Standardized Balance Assessment: Merrilee Jansky Balance Test ?Merrilee Jansky Balance Test ?Sit to Stand: Able to stand  independently using hands ?Standing Unsupported:  Able to stand 2 minutes with supervision ?Sitting with Back Unsupported but Feet Supported on Floor or Stool: Able to sit safely and securely 2 minutes ?Stand to Sit: Controls descent by using hands ?Transfers: Able to transfer with verbal cueing and /or supervision ?Standing Unsupported with Eyes Closed: Able to stand 10 seconds with supervision ?Standing Ubsupported with Feet Together: Able to place feet together independently and stand for 1 minute with supervision ?From Standing, Reach Forward with Outstretched Arm: Reaches forward but needs supervision ?From Standing Position, Pick up Object from Floor: Able to pick up shoe, needs supervision ?From Standing Position, Turn to Look Behind Over each Shoulder: Needs supervision when turning ?Turn 360 Degrees: Needs close supervision or verbal cueing (turns very slowly and guarded but does not require assistance) ?Standing Unsupported, Alternately Place Feet on Step/Stool: Able to complete >2 steps/needs minimal assist ?Standing Unsupported, One Foot in Front: Able to take small step independently and hold 30 seconds ?Standing on One Leg: Unable to try or needs assist to prevent fall ?Total Score: 30 ?Static Sitting Balance ?Static Sitting - Balance Support: Feet supported ?Static Sitting - Level of Assistance: 6: Modified independent  (Device/Increase time) ?Dynamic Sitting Balance ?Dynamic Sitting - Balance Support: Feet supported ?Dynamic Sitting - Level of Assistance: 6: Modified independent (Device/Increase time) ?Static Standing Balance ?Static Standing - Balance Support: During functional activity;Bilateral upper extremity supported ?Static Standing - Level of Assistance: 5: Stand by assistance ?Dynamic Standing Balance ?Dynamic Standing - Balance Support: During functional activity;Bilateral upper extremity supported ?Dynamic Standing - Level of Assistance: 5: Stand by assistance;Other (comment) (CGA) ?Extremity Assessment   ? ?RLE Assessment: Exceptions to [facility] ?Active Range of Motion (AROM) Comments: WFL ?General Strength Comments: MMT grossly 4-/5 at hip, 4+ knee extension, 4-/ 5knee flexion, 4-/5 DF/ PF  ?LLE Assessment: Exceptions to [facility] ?Active Range of Motion (AROM) Comments: WFL ?General Strength Comments: MMT grossly 4+ to 5/5  ? ? ?Tawana Scale , PT, DPT, NCS, CSRS ?03/27/2022, 2:18 PM ?Alger Simons PT, DPT ?03/27/2022, 5:56 PM ?

## 2022-03-27 NOTE — Progress Notes (Signed)
Inpatient Rehabilitation Discharge Medication Review by a Pharmacist ? ?A complete drug regimen review was completed for this patient to identify any potential clinically significant medication issues. ? ?High Risk Drug Classes Is patient taking? Indication by Medication  ?Antipsychotic No   ?Anticoagulant No   ?Antibiotic No   ?Opioid No   ?Antiplatelet No   ?Hypoglycemics/insulin Yes Metformin, Januvia for DM  ?Vasoactive Medication Yes Avapro for BP  ?Chemotherapy No   ?Other Yes Fosamax for osteoporosis ?Lipitor for HLD ?PPI for GERD ?Timolol for glaucoma  ? ? ? ?Type of Medication Issue Identified Description of Issue Recommendation(s)  ?Drug Interaction(s) (clinically significant) ?    ?Duplicate Therapy ?    ?Allergy ?    ?No Medication Administration End Date ?    ?Incorrect Dose ?    ?Additional Drug Therapy Needed ?    ?Significant med changes from prior encounter (inform family/care partners about these prior to discharge).    ?Other ?    ? ? ?Clinically significant medication issues were identified that warrant physician communication and completion of prescribed/recommended actions by midnight of the next day:  No ? ?Pharmacist comments: None ? ?Time spent performing this drug regimen review (minutes):  20 minutes ? ? ?Elwin Sleight ?03/27/2022 9:32 AM ?

## 2022-03-27 NOTE — Plan of Care (Signed)
?  Problem: RH Swallowing ?Goal: LTG Patient will consume least restrictive diet using compensatory strategies with assistance (SLP) ?Description: LTG:  Patient will consume least restrictive diet using compensatory strategies with assistance (SLP) ?Outcome: Completed/Met ?  ?Problem: RH Cognition - SLP ?Goal: RH LTG Patient will demonstrate orientation with cues ?Description:  LTG:  Patient will demonstrate orientation to person/place/time/situation with cues (SLP)   ?Outcome: Completed/Met ?  ?Problem: RH Comprehension Communication ?Goal: LTG Patient will comprehend basic/complex auditory (SLP) ?Description: LTG: Patient will comprehend basic/complex auditory information with cues (SLP). ?Outcome: Completed/Met ?  ?Problem: RH Expression Communication ?Goal: LTG Patient will express needs/wants via multi-modal(SLP) ?Description: LTG:  Patient will express needs/wants via multi-modal communication (gestures/written, etc) with cues (SLP) ?Outcome: Completed/Met ?Goal: LTG Patient will verbally express basic/complex needs(SLP) ?Description: LTG:  Patient will verbally express basic/complex needs, wants or ideas with cues  (SLP) ?Outcome: Completed/Met ?  ?Problem: RH Attention ?Goal: LTG Patient will demonstrate this level of attention during functional activites (SLP) ?Description: LTG:  Patient will will demonstrate this level of attention during functional activites (SLP) ?Outcome: Completed/Met ?  ?Problem: RH Awareness ?Goal: LTG: Patient will demonstrate awareness during functional activites type of (SLP) ?Description: LTG: Patient will demonstrate awareness during functional activites type of (SLP) ?Outcome: Completed/Met ?  ?

## 2022-03-27 NOTE — Progress Notes (Signed)
Speech Language Pathology Discharge Summary ? ?Patient Details  ?Name: Paula Combs ?MRN: [id number] ?Date of Birth: [birth year] ? ?Today's Date: 03/27/2022 ?SLP Individual Time: 7741-4239 ?SLP Individual Time Calculation (min): 45 min ? ?Skilled Therapeutic Interventions: Skilled ST treatment focused on pt/family education with pt's son-in-law. SLP facilitated education re: expressive/receptive language deficits s/p CVA; cognitive deficits impacting insight and safety awareness; diet recommendations and precautions. SLP provided son-in-law with handouts containing additional information on aphasia and communication strategies to enhance functional communication. SLP reinforced recommendations for 24 hour supervision due to cognitive and language deficits. Son-in-law verbalized understanding and all questions were addressed. Patient was left in bed with alarm activated and immediate needs within reach at end of session.  ? ?Patient has met 7 of 7 long term goals.  Patient to discharge at overall Min;Mod level.  ?Reasons goals not met:    ? ?Clinical Impression/Discharge Summary: Patient has made functional gains and has met 7 of 7 long-term goals this admission due to improved expressive/receptive language skills, orientation, intellectual awareness, sustained attention, and tolerance of current diet without evidence of aspiration. 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 Pt remains stimulable for skilled interventions as evident by subtle improvement noted in pt's performance from session to session. Patient and family education is complete and patient to discharge at overall min-to-mod A for communication, min A for cognition, and min A for implementation of aspiration precautions with PO intake. Patient's care partner is independent to provide the necessary physical  and cognitive assistance at discharge. Patient would benefit from continued SLP services in home health setting to maximize speech/language, cognitive, and swallow functions. ? ?Care Partner:  ?Caregiver Able to Provide Assistance: Yes  ?Type of Caregiver Assistance: Physical;Cognitive ? ?Recommendation:  ?24 hour supervision/assistance;Home Health SLP  ?Rationale for SLP Follow Up: Maximize functional communication;Maximize swallowing safety;Reduce caregiver burden;Maximize cognitive function and independence  ? ?Equipment: None recommended  ? ?Reasons for discharge: Discharged from hospital;Treatment goals met  ? ?Patient/Family Agrees with Progress Made and Goals Achieved: Yes  ? ? ?Paula Combs ?03/27/2022, 4:22 PM ? ?

## 2022-03-27 NOTE — Progress Notes (Signed)
Patient ID: Paula Combs, female   DOB: 01/12/1961, 61 y.o.   MRN: [id number] ? ?This SW covering for primary SW, Erlene Quan.  ? ?SW waiting on updates from Amy[facility] about HHPT/OT/SLP referral.  ? ?[place] referral accepted by [facility].  ? ?SW met with pt son in room using [employer] [phone] to discuss above. No questions/concerns reported.  ? ?Loralee Pacas, MSW, LCSWA ?Office: [phone] ?Cell: [phone] ?Fax: [phone]  ?

## 2022-03-27 NOTE — Plan of Care (Signed)
?  Problem: RH Grooming °Goal: LTG Patient will perform grooming w/assist,cues/equip (OT) °Description: LTG: Patient will perform grooming with assist, with/without cues using equipment (OT) °Outcome: Completed/Met °  °Problem: RH Bathing °Goal: LTG Patient will bathe all body parts with assist levels (OT) °Description: LTG: Patient will bathe all body parts with assist levels (OT) °Outcome: Completed/Met °  °Problem: RH Dressing °Goal: LTG Patient will perform upper body dressing (OT) °Description: LTG Patient will perform upper body dressing with assist, with/without cues (OT). °Outcome: Completed/Met °Goal: LTG Patient will perform lower body dressing w/assist (OT) °Description: LTG: Patient will perform lower body dressing with assist, with/without cues in positioning using equipment (OT) °Outcome: Completed/Met °  °Problem: RH Toileting °Goal: LTG Patient will perform toileting task (3/3 steps) with assistance level (OT) °Description: LTG: Patient will perform toileting task (3/3 steps) with assistance level (OT)  °Outcome: Completed/Met °  °Problem: RH Toilet Transfers °Goal: LTG Patient will perform toilet transfers w/assist (OT) °Description: LTG: Patient will perform toilet transfers with assist, with/without cues using equipment (OT) °Outcome: Completed/Met °  °Problem: RH Tub/Shower Transfers °Goal: LTG Patient will perform tub/shower transfers w/assist (OT) °Description: LTG: Patient will perform tub/shower transfers with assist, with/without cues using equipment (OT) °Outcome: Completed/Met °  °

## 2022-03-27 NOTE — Progress Notes (Signed)
Physical Therapy Session Note ? ?Patient Details  ?Name: Paula Combs ?MRN: [id number] ?Date of Birth: [birth year] ? ?Today's Date: 03/27/2022 ?PT Individual Time: 4098-1191 ?PT Individual Time Calculation (min): 42 min  ? ?Short Term Goals: ?Week 1:  PT Short Term Goal 1 (Week 1): Pt will perform supine<>sit with supervision ?PT Short Term Goal 2 (Week 1): Pt will perform sit<>stands using LRAD with CGA ?PT Short Term Goal 3 (Week 1): Pt will perform bed<>chair transfers using LRAD with CGA ?PT Short Term Goal 4 (Week 1): Pt will ambulate at least 152ft using LRAD with CGA ?PT Short Term Goal 5 (Week 1): Pt will ascend/descend 4 steps using HRs with CGA ? ? ?Skilled Therapeutic Interventions/Progress Updates:  ?Patient supine in bed on entrance to room. Patient alert and agreeable to PT session. Son-In-Law in room and present for family education. PA present at start of session for discharge information to son-in-law. son-in-law has questions re: logistics of tomorrow morning and discharging. Answered questions to best of abilities and pt's son appreciative.  ? ?Patient with no pain complaint throughout session. Describes "tickling" sensation on BLE during sensation testing.  ? ?Therapeutic Activity: ?Bed Mobility: Patient performed supine <> sit with close supervision and requires removal of bed linens. VC/ tc required for effort. Requires extra time. Return to supine at end of session with  ?Transfers: Patient performed sit<>stand and stand pivot transfers throughout session with close supervision. Provided verbal cues for impulsivity. Educated son-in-law re; pt's good ability with standing balance, however will want to be close to pt in all transfers. Abilities will continue to improve with work from pt.  ? ?Neuromuscular Re-ed: ?NMR facilitated during session with focus on sensation, standing and seated balance. Pt guided in sensory assessment of BLE, seated and standing balance. Sensory assessment with pt relating  that LT to RLE feels like "tickling" compared to the pressure applied to LLE.  ? ?Pt's abilities to resist movement has significantly improved demonstrating improved balance. Able to withstand min/ mod perturbations. In sitting and standing. NMR performed for improvements in motor control and coordination, balance, sequencing, judgement, and self confidence/ efficacy in performing all aspects of mobility at highest level of independence.  ? ?Son-in-law is able to demonstrate learning from previous sessions re: hold of gait belt and providing CGA during mobility.  ? ?Education provided to pt through son-in-law for improved effort and continued participation in hard work each day to progress, otherwise she may start to decline and require need to compensate.  ? ?Patient supine  in bed at end of session with brakes locked, bed alarm set, and all needs within reach. ? ? ?Therapy Documentation ?Precautions:  ?Precautions ?Precautions: Fall, Other (comment) ?Precaution Comments: mild R hemiparesis, possible R inattention ?Restrictions ?Weight Bearing Restrictions: No ?General: ?  ?Vital Signs: ?Therapy Vitals ?Temp: 98.1 ?F (36.7 ?C) ?Temp Source: Oral ?Pulse Rate: 91 ?Resp: 18 ?BP: 116/82 ?Patient Position (if appropriate): Lying ?Oxygen Therapy ?SpO2: 95 % ?O2 Device: Room Air ?Pain: ? No pain complaint this session.  ? ?Therapy/Group: Individual Therapy ? ?Loel Dubonnet PT, DPT ?03/27/2022, 5:41 AM  ?

## 2022-03-28 DIAGNOSIS — E669 Obesity, unspecified: Secondary | ICD-10-CM

## 2022-03-28 LAB — GLUCOSE, CAPILLARY: Glucose-Capillary: 120 mg/dL — ABNORMAL HIGH (ref 70–99)

## 2022-03-28 NOTE — Progress Notes (Signed)
?                                                       PROGRESS NOTE ? ? ?Subjective/Complaints: ?No new complaints. ? ?ROS: Limited due to language/communication  ?No CP, SOB, N/V/D/C, no fever or chills ? ?Objective: ?  ?No results found. ?No results for input(s): WBC, HGB, HCT, PLT in the last 72 hours. ? ? ?No results for input(s): NA, K, CL, CO2, GLUCOSE, BUN, CREATININE, CALCIUM in the last 72 hours. ? ? ? ?Intake/Output Summary (Last 24 hours) at 03/28/2022 0936 ?Last data filed at 03/28/2022 0800 ?Gross per 24 hour  ?Intake 297 ml  ?Output --  ?Net 297 ml  ? ?  ? ?  ? ?Physical Exam: ?Vital Signs ?Blood pressure 124/78, pulse 92, temperature 98.1 ?F (36.7 ?C), temperature source Oral, resp. rate 18, height 4\' 11"  (1.499 m), weight 62.2 kg, SpO2 96 %. ? ?Gen: no distress, normal appearing ?HEENT: oral mucosa pink and moist, NCAT ?Cardio: Reg rate ?Chest: normal effort, non-labored ?Abd: soft, non-distended ?Ext: no edema ?Psych: pleasant, normal affect ? ? ?Skin: No evidence of breakdown, no evidence of rash ?Neurologic: Cranial nerves II through XII grossly intact, motor strength is 4/5 in RIght and 5/5 left deltoid, bicep, tricep, grip, hip flexor, knee extensors, ankle dorsiflexor and plantar flexor ? ?Musculoskeletal: Full range of motion in all 4 extremities. No joint swelling ? ? ?Assessment/Plan: ?1. Functional deficits which require 3+ hours per day of interdisciplinary therapy in a comprehensive inpatient rehab setting. ?Physiatrist is providing close team supervision and 24 hour management of active medical problems listed below. ?Physiatrist and rehab team continue to assess barriers to discharge/monitor patient progress toward functional and medical goals ? ?Care Tool: ? ?Bathing ?   ?Body parts bathed by patient: Right arm, Right lower leg, Left lower leg, Left arm, Chest, Face, Abdomen, Front perineal area, Buttocks, Right upper leg, Left upper leg  ?   ?  ?  ?Bathing assist Assist Level:  Supervision/Verbal cueing ?  ?  ?Upper Body Dressing/Undressing ?Upper body dressing   ?What is the patient wearing?: Pull over shirt ?   ?Upper body assist Assist Level: Set up assist ?   ?Lower Body Dressing/Undressing ?Lower body dressing ? ? ?   ?What is the patient wearing?: Pants, Incontinence brief ? ?  ? ?Lower body assist Assist for lower body dressing: Contact Guard/Touching assist ?   ? ?Toileting ?Toileting    ?Toileting assist Assist for toileting: Supervision/Verbal cueing ?  ?  ?Transfers ?Chair/bed transfer ? ?Transfers assist ?   ? ?Chair/bed transfer assist level: Supervision/Verbal cueing ?Chair/bed transfer assistive device: Walker ?  ?Locomotion ?Ambulation ? ? ?Ambulation assist ? ?   ? ?Assist level: Supervision/Verbal cueing ?Assistive device: Walker-rolling ?Max distance: 142ft  ? ?Walk 10 feet activity ? ? ?Assist ?   ? ?Assist level: Supervision/Verbal cueing ?Assistive device: Walker-rolling  ? ?Walk 50 feet activity ? ? ?Assist   ? ?Assist level: Supervision/Verbal cueing ?Assistive device: Walker-rolling  ? ? ?Walk 150 feet activity ? ? ?Assist Walk 150 feet activity did not occur: Safety/medical concerns ? ?Assist level: Supervision/Verbal cueing ?Assistive device: Walker-rolling ?  ? ?Walk 10 feet on uneven surface  ?activity ? ? ?Assist   ? ? ?Assist level: Contact Guard/Touching assist ?Assistive device:  Walker-rolling  ? ?Wheelchair ? ? ? ? ?Assist Is the patient using a wheelchair?: No ?Type of Wheelchair: Manual ?  ? ?Wheelchair assist level: Dependent - Patient 0% ?   ? ? ?Wheelchair 50 feet with 2 turns activity ? ? ? ?Assist ? ?  ?  ? ? ?Assist Level: Dependent - Patient 0%  ? ?Wheelchair 150 feet activity  ? ? ? ?Assist ?   ? ? ?Assist Level: Dependent - Patient 0%  ? ?Blood pressure 124/78, pulse 92, temperature 98.1 ?F (36.7 ?C), temperature source Oral, resp. rate 18, height 4\' 11"  (1.499 m), weight 62.2 kg, SpO2 96 %. ? ?Medical Problem List and Plan: ?1. 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            -patient may shower ?            -ELOS/Goals: DC Today ?        -Continue CIR therapies including PT, OT, and SLP   ?2.  Antithrombotics: ?-DVT/anticoagulation:  Mechanical:  Antiembolism stockings, knee (TED hose) Bilateral lower extremities ?            -antiplatelet therapy: none ?3. Pain Management: Tylenol as needed ?4. Mood: LCSW to evaluate and provide emotional support ?            -antipsychotic agents: n/a ?5. Neuropsych: This patient is not capable of making decisions on her own behalf. ?6. Skin/Wound Care:  local care ?7. Dysphagia: provide assistance with set up of meals ?            --continue dysphagia 3 diet, thin liquids---tolerating ?8: Right eye blindness: chronic ?9: DM2: Continue CBGs QID and SSI>>controlled ?            --on Jardiance and metformin at home ?            5/7-increase metformin to 500mg  bid. On 1000mg  bid at home ?CBG (last 3)  ?Recent Labs  ?  03/27/22 ?1650 03/27/22 ?2104 03/28/22 ?0604  ?GLUCAP 88 124* 120*  ? ?CBG well controlled, continue metformin, continue healthy diet ? 5/12: increase metformin to 850mg  BID ? 5/13 glucose well controlled ? ?10: Hypertension: closely monitor with goal sys BP < 160 ?Vitals:  ? 03/27/22 2015 03/28/22 0303  ?BP: 112/73 124/78  ?Pulse: 83 92  ?Resp: 18 18  ?Temp: 98.1 ?F (36.7 ?C) 98.1 ?F (36.7 ?C)  ?SpO2: 96% 96%  ?BP well controlled ? ?11: Hyperlipidemia: statin on hold due to IPH, discussed healthy diet ?            --home atorvastatin 40 mg (neuro rec: increasing dose at discharge) ?12: Primary open-angle glaucoma: continue Timoptic ?13: Overweight: BMI 27.7; Advised healthy diet ?  ? ?LOS: ?10 days ?A FACE TO FACE EVALUATION WAS PERFORMED ? ?Jennye Boroughs ?03/28/2022, 9:36 AM  ? ? ? ?

## 2022-03-28 NOTE — Progress Notes (Addendum)
?                                                       PROGRESS NOTE ? ? ?Subjective/Complaints: ?No new complaints or concerns today. Going home today. No questions ? ?ROS: Limited due to language/communication  ?No CP, SOB, N/V/D/C, No Fever, chills ? ?Objective: ?  ?No results found. ?No results for input(s): WBC, HGB, HCT, PLT in the last 72 hours. ? ? ?No results for input(s): NA, K, CL, CO2, GLUCOSE, BUN, CREATININE, CALCIUM in the last 72 hours. ? ? ? ?Intake/Output Summary (Last 24 hours) at 03/28/2022 0940 ?Last data filed at 03/28/2022 0800 ?Gross per 24 hour  ?Intake 297 ml  ?Output --  ?Net 297 ml  ? ?  ? ?  ? ?Physical Exam: ?Vital Signs ?Blood pressure 124/78, pulse 92, temperature 98.1 ?F (36.7 ?C), temperature source Oral, resp. rate 18, height 4\' 11"  (1.499 m), weight 62.2 kg, SpO2 96 %. ? ?Gen: no distress, normal appearing, lying in bed  ?HEENT: oral mucosa pink and moist, NCAT ?Cardio: Reg rate ?Chest: normal effort, non-labored breathing ?Abd: soft, non-distended ?Ext: no edema or cyanosis ?Psych: pleasant, normal affect ? ? ?Skin: No evidence of breakdown, no evidence of rash ?Neurologic: Cranial nerves II through XII grossly intact, motor strength is 4/5 in RIght and 5/5 left deltoid, bicep, tricep, grip, hip flexor, knee extensors, ankle dorsiflexor and plantar flexor ? ?Musculoskeletal: Full range of motion in all 4 extremities. No joint swelling ? ? ?Assessment/Plan: ?1. Functional deficits which require 3+ hours per day of interdisciplinary therapy in a comprehensive inpatient rehab setting. ?Physiatrist is providing close team supervision and 24 hour management of active medical problems listed below. ?Physiatrist and rehab team continue to assess barriers to discharge/monitor patient progress toward functional and medical goals ? ?Care Tool: ? ?Bathing ?   ?Body parts bathed by patient: Right arm, Right lower leg, Left lower leg, Left arm, Chest, Face, Abdomen, Front perineal area,  Buttocks, Right upper leg, Left upper leg  ?   ?  ?  ?Bathing assist Assist Level: Supervision/Verbal cueing ?  ?  ?Upper Body Dressing/Undressing ?Upper body dressing   ?What is the patient wearing?: Pull over shirt ?   ?Upper body assist Assist Level: Set up assist ?   ?Lower Body Dressing/Undressing ?Lower body dressing ? ? ?   ?What is the patient wearing?: Pants, Incontinence brief ? ?  ? ?Lower body assist Assist for lower body dressing: Contact Guard/Touching assist ?   ? ?Toileting ?Toileting    ?Toileting assist Assist for toileting: Supervision/Verbal cueing ?  ?  ?Transfers ?Chair/bed transfer ? ?Transfers assist ?   ? ?Chair/bed transfer assist level: Supervision/Verbal cueing ?Chair/bed transfer assistive device: Walker ?  ?Locomotion ?Ambulation ? ? ?Ambulation assist ? ?   ? ?Assist level: Supervision/Verbal cueing ?Assistive device: Walker-rolling ?Max distance: 113ft  ? ?Walk 10 feet activity ? ? ?Assist ?   ? ?Assist level: Supervision/Verbal cueing ?Assistive device: Walker-rolling  ? ?Walk 50 feet activity ? ? ?Assist   ? ?Assist level: Supervision/Verbal cueing ?Assistive device: Walker-rolling  ? ? ?Walk 150 feet activity ? ? ?Assist Walk 150 feet activity did not occur: Safety/medical concerns ? ?Assist level: Supervision/Verbal cueing ?Assistive device: Walker-rolling ?  ? ?Walk 10 feet on uneven surface  ?activity ? ? ?  Assist   ? ? ?Assist level: Contact Guard/Touching assist ?Assistive device: Walker-rolling  ? ?Wheelchair ? ? ? ? ?Assist Is the patient using a wheelchair?: No ?Type of Wheelchair: Manual ?  ? ?Wheelchair assist level: Dependent - Patient 0% ?   ? ? ?Wheelchair 50 feet with 2 turns activity ? ? ? ?Assist ? ?  ?  ? ? ?Assist Level: Dependent - Patient 0%  ? ?Wheelchair 150 feet activity  ? ? ? ?Assist ?   ? ? ?Assist Level: Dependent - Patient 0%  ? ?Blood pressure 124/78, pulse 92, temperature 98.1 ?F (36.7 ?C), temperature source Oral, resp. rate 18, height 4\' 11"  (1.499  m), weight 62.2 kg, SpO2 96 %. ? ?Medical Problem List and Plan: ?1. 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            -patient may shower ?            -ELOS/Goals: DC day 5/13 expected, goal mod A communication, min a safety/cog, min A swallowing ?        -DC home today ?2.  Antithrombotics: ?-DVT/anticoagulation:  Mechanical:  Antiembolism stockings, knee (TED hose) Bilateral lower extremities ?            -antiplatelet therapy: none ?3. Pain Management: Tylenol as needed ?4. Mood: LCSW to evaluate and provide emotional support ?            -antipsychotic agents: n/a ?5. Neuropsych: This patient is not capable of making decisions on her own behalf. ?6. Skin/Wound Care:  local care ?7. Dysphagia: provide assistance with set up of meals ?            --continue dysphagia 3 diet, thin liquids---tolerating ?8: Right eye blindness: chronic ?9: DM2: Continue CBGs QID and SSI>>controlled ?            --on Jardiance and metformin at home ?            5/7-increase metformin to 500mg  bid. On 1000mg  bid at home ?CBG (last 3)  ?Recent Labs  ?  03/27/22 ?1650 03/27/22 ?2104 03/28/22 ?0604  ?GLUCAP 88 124* 120*  ? ?CBG well controlled, continue metformin, continue healthy diet ? 5/12: increase metformin to 850mg  BID ? 5/13 GBG appear good overall, continue metformin 850 BID ? ?10: Hypertension: closely monitor with goal sys BP < 160 ?Vitals:  ? 03/27/22 2015 03/28/22 0303  ?BP: 112/73 124/78  ?Pulse: 83 92  ?Resp: 18 18  ?Temp: 98.1 ?F (36.7 ?C) 98.1 ?F (36.7 ?C)  ?SpO2: 96% 96%  ?BP well controlled ? ?11: Hyperlipidemia: statin on hold due to IPH, discussed healthy diet ?            --home atorvastatin 40 mg (neuro rec: increasing dose at discharge) ?12: Primary open-angle glaucoma: continue Timoptic ?13: Overweight: BMI 27.7; Discussed healthy diet  ?  ? ?LOS: ?10 days ?A FACE TO FACE EVALUATION WAS PERFORMED ? ?6/13 ?03/28/2022, 9:40 AM  ? ? ? ?

## 2022-03-28 NOTE — Plan of Care (Signed)
  Problem: RH SKIN INTEGRITY Goal: RH STG SKIN FREE OF INFECTION/BREAKDOWN Outcome: Progressing   Problem: RH SAFETY Goal: RH STG ADHERE TO SAFETY PRECAUTIONS W/ASSISTANCE/DEVICE Description: STG Adhere to Safety Precautions With Assistance/Device. Outcome: Progressing   

## 2022-03-28 NOTE — Progress Notes (Signed)
Housekeeping found a pair of tennis shoes under the bed when cleaning. Called patient's son in law who is the emergency contact to let him know and he said to throw the shoes away. ?

## 2022-03-28 NOTE — Progress Notes (Signed)
Pt discharged home with family. Belongings packed up and given to family. Discharge packet reviewed with family by [place], [place]. Verbalizes understanding.  ?

## 2022-03-30 NOTE — Plan of Care (Signed)
?  Problem: RH Balance ?Goal: LTG Patient will maintain dynamic sitting balance (PT) ?Description: LTG:  Patient will maintain dynamic sitting balance with assistance during mobility activities (PT) ?Outcome: Completed/Met ?Flowsheets (Taken 03/19/2022 1259 by Tawana Scale, PT) ?LTG: Pt will maintain dynamic sitting balance during mobility activities with:: Supervision/Verbal cueing ?Goal: LTG Patient will maintain dynamic standing balance (PT) ?Description: LTG:  Patient will maintain dynamic standing balance with assistance during mobility activities (PT) ?Outcome: Completed/Met ?Flowsheets (Taken 03/19/2022 1259 by Tawana Scale, PT) ?LTG: Pt will maintain dynamic standing balance during mobility activities with:: Supervision/Verbal cueing ?  ?Problem: Sit to Stand ?Goal: LTG:  Patient will perform sit to stand with assistance level (PT) ?Description: LTG:  Patient will perform sit to stand with assistance level (PT) ?Outcome: Completed/Met ?Flowsheets (Taken 03/19/2022 1259 by Tawana Scale, PT) ?LTG: PT will perform sit to stand in preparation for functional mobility with assistance level: Supervision/Verbal cueing ?  ?Problem: RH Bed Mobility ?Goal: LTG Patient will perform bed mobility with assist (PT) ?Description: LTG: Patient will perform bed mobility with assistance, with/without cues (PT). ?Outcome: Completed/Met ?Flowsheets (Taken 03/19/2022 1259 by Tawana Scale, PT) ?LTG: Pt will perform bed mobility with assistance level of: Supervision/Verbal cueing ?  ?Problem: RH Bed to Chair Transfers ?Goal: LTG Patient will perform bed/chair transfers w/assist (PT) ?Description: LTG: Patient will perform bed to chair transfers with assistance (PT). ?03/30/2022 0331 by Alger Simons, PT ?Outcome: Completed/Met ?Flowsheets (Taken 03/19/2022 1259 by Tawana Scale, PT) ?LTG: Pt will perform Bed to Chair Transfers with assistance level: Supervision/Verbal cueing ?03/30/2022 0328 by Alger Simons, PT ?Flowsheets  (Taken 03/19/2022 1259 by Tawana Scale, PT) ?LTG: Pt will perform Bed to Chair Transfers with assistance level: Supervision/Verbal cueing ?  ?Problem: RH Car Transfers ?Goal: LTG Patient will perform car transfers with assist (PT) ?Description: LTG: Patient will perform car transfers with assistance (PT). ?Outcome: Completed/Met ?Flowsheets (Taken 03/19/2022 1259 by Tawana Scale, PT) ?LTG: Pt will perform car transfers with assist:: Supervision/Verbal cueing ?  ?Problem: RH Ambulation ?Goal: LTG Patient will ambulate in controlled environment (PT) ?Description: LTG: Patient will ambulate in a controlled environment, # of feet with assistance (PT). ?Outcome: Completed/Met ?Flowsheets (Taken 03/19/2022 1259 by Tawana Scale, PT) ?LTG: Pt will ambulate in controlled environ  assist needed:: Supervision/Verbal cueing ?LTG: Ambulation distance in controlled environment: 142f using LRAD ?Goal: LTG Patient will ambulate in home environment (PT) ?Description: LTG: Patient will ambulate in home environment, # of feet with assistance (PT). ?Outcome: Completed/Met ?Flowsheets (Taken 03/19/2022 1259 by PTawana Scale PT) ?LTG: Pt will ambulate in home environ  assist needed:: Supervision/Verbal cueing ?LTG: Ambulation distance in home environment: 571fusing LRAD ?  ?Problem: RH Stairs ?Goal: LTG Patient will ambulate up and down stairs w/assist (PT) ?Description: LTG: Patient will ambulate up and down # of stairs with assistance (PT) ?Outcome: Completed/Met ?Flowsheets (Taken 03/19/2022 1259 by PiTawana ScalePT) ?LTG: Pt will ambulate up/down stairs assist needed:: Supervision/Verbal cueing ?LTG: Pt will  ambulate up and down number of stairs: 4 steps using handrails per home set-up ?  ?

## 2022-03-30 NOTE — Progress Notes (Signed)
Inpatient Rehabilitation Care Coordinator ?Discharge Note  ? ?Patient Details  ?Name: Paula Combs ?MRN: [id number] ?Date of Birth: 1961-01-28 ? ? ?Discharge location: Home ? ?Length of Stay: 10 Days ? ?Discharge activity level: Sup/Min ? ?Home/community participation: son ? ?Patient response [id number] Literacy - How often do you need to have someone help you when you read instructions, pamphlets, or other written material from your doctor or pharmacy?: Always ? ?Patient response [id number] Isolation - How often do you feel lonely or isolated from those around you?: Patient unable to respond ? ?Services provided included: SW, Pharmacy, TR, CM, Neuropsych, RN, SLP, OT, PT, RD, MD ? ?Financial Services:  ?Field seismologist Utilized: Medicaid ?  ? ?Choices offered to/list presented to: patient/family ? ?Follow-up services arranged:  ?Home Health ?Home Health Agency: [facility]  ?  ?  ?  ? ?Patient response to transportation need: ?Is the patient able to respond to transportation needs?: Yes ?In the past 12 months, has lack of transportation kept you from medical appointments or from getting medications?: No ?In the past 12 months, has lack of transportation kept you from meetings, work, or from getting things needed for daily living?: No ? ? ? ?Comments (or additional information): ? ?Patient/Family verbalized understanding of follow-up arrangements:  Yes ? ?Individual responsible for coordination of the follow-up plan: Clelia Croft ? ?Confirmed correct DME delivered: Andria Rhein 03/30/2022   ? ?Andria Rhein ?

## 2022-04-03 ENCOUNTER — Telehealth: Payer: Self-pay | Admitting: *Deleted

## 2022-04-03 NOTE — Telephone Encounter (Signed)
Donnie [facility] called for POC 1wk4 HEP. Approval given.

## 2022-04-24 ENCOUNTER — Other Ambulatory Visit: Payer: Self-pay | Admitting: Physician Assistant

## 2022-04-27 ENCOUNTER — Telehealth: Payer: Self-pay

## 2022-04-27 NOTE — Telephone Encounter (Signed)
Pt's contact called regarding prescription refills. Referred him to call her PCP and gave phone number. Confirmed understanding.

## 2022-05-08 ENCOUNTER — Encounter: Payer: Medicaid Other | Admitting: Physical Medicine & Rehabilitation

## 2022-05-20 ENCOUNTER — Encounter: Payer: Self-pay | Admitting: Registered Nurse

## 2022-05-20 ENCOUNTER — Encounter: Payer: Medicaid Other | Attending: Physical Medicine & Rehabilitation | Admitting: Registered Nurse

## 2022-05-20 VITALS — BP 108/66 | HR 63 | Ht 59.0 in | Wt 138.0 lb

## 2022-05-20 DIAGNOSIS — I10 Essential (primary) hypertension: Secondary | ICD-10-CM | POA: Insufficient documentation

## 2022-05-20 DIAGNOSIS — E119 Type 2 diabetes mellitus without complications: Secondary | ICD-10-CM | POA: Insufficient documentation

## 2022-05-20 NOTE — Progress Notes (Signed)
Subjective:    Patient ID: Paula Combs, female    DOB: 26-Jun-1961, 61 y.o.   MRN: [id number]  HPI: Paula Combs is a 61 y.o. female who is here for HFU appointment for F/U of her  ICH ( Intracerebral Hemorrhage), Essential Hypertension and Type 2 Diabetes Mellitus without complication. She presented to emergency room on 03/14/2022 with confusion, slurred speech and facial droop.  On 03/14/2022 Dr Iver Nestle Note Neurology H&P   CC: Aphasia and right sided weakness    History is obtained from: Emergency Contact    HPI: Paula Combs is a 61 y.o. female with PMHx, DM, HTN, HLD, glaucoma c/b loss of vision in the right eye, POAG of the left eye, ACOM aneurysm s/p coil   Unfortunately history is very limited as patient and patient's primary caregiver (daughter) both only speak to [name], a language that we do not have interpretation services for.  Son-in-law is able to provide some history.  He reports that she did have a stroke in 2000 02/2004 with some residual difficulty walking which had gradually improved.  He is unsure of the laterality of her weakness at that time.   Yesterday the entire day she was talking normally until she went to bed around 7 or 8 PM.  This morning on awakening she was unable to talk properly.  She seemed to have more difficulty speaking herself than understanding what she was being asked to do by family.  She notes that her blood pressure medications have been adjusted recently and she is supposed to be taking 2 pills of a particular medication but she was only taking 1.  He is unsure of the details of her medication as his wife manages this   Regarding other recent symptoms, he notes that may be her weight has been changing some recently but it is difficult to be sure whether it is increasing or decreasing or simply fluctuating.  He also notes that she had a fever last week but this is improved.  CT Angio: Head and Neck  IMPRESSION: Acute parenchymal hemorrhage involving the  left basal ganglia with mild edema. No intraventricular extension or significant mass effect. No abnormal vascularity in the region of hemorrhage.   These results were called by telephone at the time of interpretation on 03/14/2022 at 4:02 pm to provider DAVID YAO , who verbally acknowledged these results.   Coiled anterior communicating artery region aneurysm. No definite residual or recurrent aneurysm.   No hemodynamically significant stenosis in the neck.   Plaque causing mild to moderate stenosis of the left M1/M2 MCA as the vessel curves posteriorly.   Nonspecific extensive paranasal sinus opacification and bilateral patchy mastoid and middle ear opacification.    CT Head:  IMPRESSION: Unchanged acute intraparenchymal hemorrhage involving the left basal ganglia, with unchanged surrounding mild edema without significant mass effect or midline shift. No intraventricular extension.   Used Radiation protection practitioner to speak with Paula Combs, she is [place] , family in room. She denies any pain . She rates her pain 0. Also reports her appetite is poor, will continue to monitor. She has completed Home Health Therapy with [facility]. She is walking with walker and continues with HEP as tolerated.   Dr Roda Shutters Note: on 03/17/2022 ASSESSMENT/PLAN Paula Combs is a 61 y.o. female with PMHx, DM, HTN, HLD, glaucoma c/b loss of vision in the right eye, POAG of the left eye, ACOM aneurysm s/p coil who presented to the ED with aphasia and  confusion which began early 4/29. CTH notable for IPH of the L basal ganglia without midline shift. ICH score of 0. SBP on arrival of 175-182.  Unable to obtain MRI due to aneurysm coil.  Cleviprex is off, on as needed BP meds IVP   ICH - L BG IPH likely due to hypertension CT head left BG ICH Repeat CT shows stable ICH CTA Head and neck: mild to moderate stenosis of of the left M1/M2, no AVM or aneurysm 2D Echo: EF 60 to 65% LDL 153 HgbA1c 5.7 VTE prophylaxis -SCDs No  antithrombotic prior to admission, now on No antithrombotic due to IPH. Therapy recommendations: CIR Disposition:  pending   Hypertension Home meds:  irbesartan Stable on Cleviprex BP goal <160 Resume home BP meds Long-term BP goal normotensive Labetolol 10mg  q2hr PRN   Hyperlipidemia Home meds:  atorvastatin 40 mg, not resumed in hospital given IPH LDL 153, goal < 70 Increase statin at discharge   Diabetes Home meds: metformin HgbA1c 5.7, goal < 7.0 CBGs SSI   Dysphagia Now on dysphagia 3 and thin liquid On gentle IV fluid   Other Stroke Risk Factors History of stroke in 2004-2005, no significant residual   Other acute issues Right eye blind ACOM aneurysm status post coiling 8 to 9 years ago   Code Status: Full Code     Hospital day # 3   Pain Inventory Average Pain 0 Pain Right Now 0 My pain is  no pain  LOCATION OF PAIN  No pain  BOWEL Number of stools per week: 7  BLADDER Normal    Mobility walk with assistance use a walker how many minutes can you walk? unknown ability to climb steps?  yes do you drive?  no use a wheelchair Do you have any goals in this area?  yes  Function I need assistance with the following:  feeding, dressing, bathing, toileting, meal prep, and shopping Do you have any goals in this area?  yes  Neuro/Psych trouble walking confusion  Prior Studies Any changes since last visit?  no  Physicians involved in your care Any changes since last visit?  no   No family history on file. Social History   Socioeconomic History   Marital status: Widowed    Spouse name: Not on file   Number of children: Not on file   Years of education: Not on file   Highest education level: Not on file  Occupational History   Not on file  Tobacco Use   Smoking status: Never   Smokeless tobacco: Never  Vaping Use   Vaping Use: Never used  Substance and Sexual Activity   Alcohol use: Not Currently   Drug use: Not Currently    Sexual activity: Not on file  Other Topics Concern   Not on file  Social History Narrative   Not on file   Social Determinants of Health   Financial Resource Strain: Not on file  Food Insecurity: Not on file  Transportation Needs: Not on file  Physical Activity: Not on file  Stress: Not on file  Social Connections: Not on file   History reviewed. No pertinent surgical history. Past Medical History:  Diagnosis Date   Controlled type 2 diabetes mellitus without complication, without long-term current use of insulin (HCC) 03/17/2022   HTN (hypertension) 03/17/2022   Hyperlipidemia 03/17/2022   BP 108/66   Pulse 63   Ht 4\' 11"  (1.499 m)   Wt 138 lb (62.6 kg)   SpO2 98%  BMI 27.87 kg/m   Opioid Risk Score:   Fall Risk Score:  `1  Depression screen [facility] 2/9     05/20/2022    3:26 PM  Depression screen PHQ 2/9  Decreased Interest 1  Down, Depressed, Hopeless 1  PHQ - 2 Score 2  Altered sleeping 3  Tired, decreased energy 1  Change in appetite 3  Feeling bad or failure about yourself  0  Trouble concentrating 1  Moving slowly or fidgety/restless 2  Suicidal thoughts 0  PHQ-9 Score 12    Review of Systems  Musculoskeletal:  Positive for gait problem.  Psychiatric/Behavioral:  Positive for confusion.   All other systems reviewed and are negative.      Objective:   Physical Exam Vitals and nursing note reviewed.  Constitutional:      Appearance: Normal appearance.  Cardiovascular:     Rate and Rhythm: Normal rate and regular rhythm.     Pulses: Normal pulses.     Heart sounds: Normal heart sounds.  Pulmonary:     Effort: Pulmonary effort is normal.     Breath sounds: Normal breath sounds.  Musculoskeletal:     Cervical back: Normal range of motion and neck supple.     Comments: Normal Muscle Bulk and Muscle Testing Reveals:  Upper Extremities: Full ROM and Muscle Strength 4/5 Lower Extremities: Full ROM and Muscle Strength 4/5 Arises from Table slowly using  walker for support Narrow Based  Gait     Skin:    General: Skin is warm and dry.  Neurological:     Mental Status: She is alert and oriented to person, place, and time.  Psychiatric:        Mood and Affect: Mood normal.        Behavior: Behavior normal.         Assessment & Plan:  ICH ( Intracerebral Hemorrhage), Has a scheduled appointment with Dr Terrace Arabia ( Neurology). Continue to Monitor.  Essential Hypertension: Continue current medication regimen. PCP following. Continue to Monitor.  3.  Type 2 Diabetes Mellitus without complication: PCP Following. Continue current medication regimen.  F/U with Dr Wynn Banker

## 2022-05-21 ENCOUNTER — Other Ambulatory Visit: Payer: Self-pay | Admitting: Physical Medicine & Rehabilitation

## 2022-06-09 ENCOUNTER — Inpatient Hospital Stay: Payer: Medicaid Other | Admitting: Neurology

## 2022-06-11 ENCOUNTER — Encounter: Payer: Self-pay | Admitting: Neurology

## 2022-06-11 ENCOUNTER — Ambulatory Visit: Payer: Medicaid Other | Admitting: Neurology

## 2022-06-11 VITALS — BP 89/55 | HR 62 | Ht 59.0 in | Wt 134.5 lb

## 2022-06-11 DIAGNOSIS — I618 Other nontraumatic intracerebral hemorrhage: Secondary | ICD-10-CM | POA: Diagnosis not present

## 2022-06-11 DIAGNOSIS — I610 Nontraumatic intracerebral hemorrhage in hemisphere, subcortical: Secondary | ICD-10-CM | POA: Diagnosis not present

## 2022-06-11 NOTE — Patient Instructions (Signed)
She is taking Blood pressure medication Irbesatan 75mg   Do not take it when BP is less than 110/60  Take 1/2 tab if BP is less than 120/70.

## 2022-06-11 NOTE — Progress Notes (Signed)
Chief Complaint  Patient presents with   Hospitalization Follow-up    Rm 15. Accompanied K Clelia Croft (son in law). Hospital f/u.      ASSESSMENT AND PLAN  Paula Combs is a 61 y.o. female Left basal ganglia intracranial hemorrhage  Most likely due to hypertension and multiple underlying vascular risk factor, hypertension, diabetes, hyperlipidemia  MRI of the brain  Emphasized importance of modifying vascular risk factor control  Return to clinic in 4 months with nurse practitioner   DIAGNOSTIC DATA (LABS, IMAGING, TESTING) - I reviewed patient records, labs, notes, testing and imaging myself where available.   MEDICAL HISTORY:  Westlyn Yeva Bissette is a 61 year old female, accompanied by her Involved seen in request by Dr. Marvel Plan, to follow-up hospital discharge for intracranial hemorrhage, she is a native of [name], does not speak English, history is from her son, primary care is at [facility],   I reviewed and summarized the referring note.PMHX. Hyperlipidemia Hypertension Diabetes  Presented to hospital on March 14, 2022 for confusion, dysarthria, right facial droop, mild elevated blood pressure upon presentation 163/99  Personally reviewed CT head without contrast, left basal ganglia acute intraparenchymal hemorrhage, with surrounding mild edema without significant mass effect, measuring 1.9 x 1.5 x 2.0 cm,  Echocardiogram normal ejection fraction no significant abnormalities. CT angiogram of head and neck showed no significant large vessel disease, evidence of intracranial atherosclerotic disease  Ultrasound of abdomen showed fatty liver  Swallowing study showed moderate oral dysphagia,  Laboratory evaluations, CBC slight decrease hemoglobin 11.4, lipid panel LDL 153, triglyceride 275, cholesterol 246, A1c 5.7, negative HIV,  She is now back back home after rehabilitation, prior to hospital admission, she was able to help family chore, not sit down most of the  time, not functioning well, can still feed herself, ambulate with a walker,  PHYSICAL EXAM:   Vitals:   06/11/22 1607  BP: (!) 89/55  Pulse: 62  Weight: 134 lb 8 oz (61 kg)  Height: 4\' 11"  (1.499 m)   Not recorded     Body mass index is 27.17 kg/m.  PHYSICAL EXAMNIATION:  Gen: NAD, conversant, well nourised, well groomed                     Cardiovascular: Regular rate rhythm, no peripheral edema, warm, nontender. Eyes: Conjunctivae clear without exudates or hemorrhage Neck: Supple, no carotid bruits. Pulmonary: Clear to auscultation bilaterally   NEUROLOGICAL EXAM:  MENTAL STATUS: Speech/cognition: Awake, depend on her son in law to provide history, language barrier CRANIAL NERVES: CN II: Right visual field deficit CN III, IV, VI: extraocular movement are normal. No ptosis. CN V: Facial sensation is intact to light touch CN VII: Face is symmetric with normal eye closure  CN VIII: Hearing is normal to causal conversation. CN IX, X: Phonation is normal. CN XI: Head turning and shoulder shrug are intact  MOTOR: Fixation of right upper extremity on rapid rotating movement, mild right leg drift  REFLEXES: Hyperreflexia at right side  SENSORY: Withdraw to pain  COORDINATION: There is no trunk or limb dysmetria noted.  GAIT/STANCE: Depend on her walker to get up, wide-based unsteady  REVIEW OF SYSTEMS:  Full 14 system review of systems performed and notable only for as above All other review of systems were negative.   ALLERGIES: No Known Allergies  HOME MEDICATIONS: Current Outpatient Medications  Medication Sig Dispense Refill   atorvastatin (LIPITOR) 40 MG tablet Take 40 mg by mouth daily.  Brinzolamide-Brimonidine (SIMBRINZA) 1-0.2 % SUSP SHAKE LIQUID AND INSTILL 1 DROP IN BOTH EYES THREE TIMES DAILY     irbesartan (AVAPRO) 75 MG tablet TAKE 1 TABLET(75 MG) BY MOUTH DAILY 30 tablet 0   metFORMIN (GLUCOPHAGE) 1000 MG tablet Take 1,000 mg by mouth 2  (two) times daily.     omeprazole (PRILOSEC) 40 MG capsule Take 40 mg by mouth daily.     sitaGLIPtin (JANUVIA) 100 MG tablet Take 100 mg by mouth daily.     No current facility-administered medications for this visit.    PAST MEDICAL HISTORY: Past Medical History:  Diagnosis Date   Controlled type 2 diabetes mellitus without complication, without long-term current use of insulin (HCC) 03/17/2022   HTN (hypertension) 03/17/2022   Hyperlipidemia 03/17/2022    PAST SURGICAL HISTORY: History reviewed. No pertinent surgical history.  FAMILY HISTORY: History reviewed. No pertinent family history.  SOCIAL HISTORY: Social History   Socioeconomic History   Marital status: Widowed    Spouse name: Not on file   Number of children: Not on file   Years of education: Not on file   Highest education level: Not on file  Occupational History   Not on file  Tobacco Use   Smoking status: Never   Smokeless tobacco: Never  Vaping Use   Vaping Use: Never used  Substance and Sexual Activity   Alcohol use: Not Currently   Drug use: Not Currently   Sexual activity: Not on file  Other Topics Concern   Not on file  Social History Narrative   Not on file   Social Determinants of Health   Financial Resource Strain: Not on file  Food Insecurity: Not on file  Transportation Needs: Not on file  Physical Activity: Not on file  Stress: Not on file  Social Connections: Not on file  Intimate Partner Violence: Not on file      Levert Feinstein, M.D. Ph.D.  [facility] [address], [address] [place], [place] [address] Ph: [phone] Fax: [phone]  CC:  Marvel Plan, MD [address] [place],  [place] [address]  [facility]

## 2022-06-13 ENCOUNTER — Encounter: Payer: Self-pay | Admitting: Neurology

## 2022-06-23 ENCOUNTER — Telehealth: Payer: Self-pay | Admitting: Neurology

## 2022-06-23 NOTE — Telephone Encounter (Signed)
[facility] [place]: [id number] exp. 06/23/22-08/21/22 sent to [facility]

## 2022-07-03 ENCOUNTER — Encounter: Payer: Medicaid Other | Attending: Physical Medicine & Rehabilitation | Admitting: Physical Medicine & Rehabilitation

## 2022-07-03 DIAGNOSIS — I10 Essential (primary) hypertension: Secondary | ICD-10-CM | POA: Insufficient documentation

## 2022-07-03 DIAGNOSIS — E119 Type 2 diabetes mellitus without complications: Secondary | ICD-10-CM | POA: Insufficient documentation

## 2022-07-18 ENCOUNTER — Ambulatory Visit (HOSPITAL_BASED_OUTPATIENT_CLINIC_OR_DEPARTMENT_OTHER)
Admission: RE | Admit: 2022-07-18 | Discharge: 2022-07-18 | Disposition: A | Discharge status: Home / Self Care | Payer: Medicaid Other | Source: Ambulatory Visit | Attending: Neurology | Admitting: Neurology

## 2022-07-18 DIAGNOSIS — I618 Other nontraumatic intracerebral hemorrhage: Secondary | ICD-10-CM | POA: Diagnosis present

## 2022-07-21 ENCOUNTER — Telehealth: Payer: Self-pay | Admitting: Neurology

## 2022-07-21 NOTE — Telephone Encounter (Signed)
Please call patient, MRI of the brain showed chronic left basal ganglion hemorrhagic stroke, chronic small vessel disease, evidence of anterior communicating artery aneurysm coling.  There was no acute abnormalities    Chronic hemorrhagic infarct of the left basal ganglia and adjacent white matter.   Additional chronic small vessel infarcts and chronic microvascular ischemic changes.   Post coiling of anterior communicating artery region aneurysm. There is evidence of adjacent prior subarachnoid hemorrhage.

## 2022-07-23 NOTE — Telephone Encounter (Signed)
Attempted to call pt, LVM for call back  °

## 2022-07-29 NOTE — Telephone Encounter (Signed)
Attempted to call pt, LVM  

## 2022-08-27 ENCOUNTER — Encounter: Payer: Medicaid Other | Admitting: Physical Medicine & Rehabilitation

## 2022-09-22 ENCOUNTER — Encounter: Payer: Medicaid Other | Attending: Physical Medicine & Rehabilitation | Admitting: Physical Medicine & Rehabilitation

## 2022-09-22 ENCOUNTER — Encounter: Payer: Self-pay | Admitting: Physical Medicine & Rehabilitation

## 2022-09-22 VITALS — BP 166/69 | HR 68 | Ht 59.0 in | Wt 136.6 lb

## 2022-09-22 DIAGNOSIS — I610 Nontraumatic intracerebral hemorrhage in hemisphere, subcortical: Secondary | ICD-10-CM | POA: Diagnosis not present

## 2022-09-22 NOTE — Progress Notes (Signed)
Subjective:    Patient ID: Paula Combs, female    DOB: [birth year], 61 y.o.   MRN: [id number] 61 y.o. female resented to the emergency department with confusion, slurred speech and right facial droop on 03/14/2022.  CT of the head was significant for IPH in the left basal ganglion without midline shift.  She was admitted to the ICU placed on clevidipine.  Unable to perform MRI due to presence of aneurysm coil.  She remained hemodynamically stable and was transferred out of the ICU on 5/2.  Modified barium swallow performed and patient placed on dysphagia 3 diet with thin liquids. Admit date: 03/18/2022 Discharge date: 03/28/2022 HPI  Paula Combs interpreter via [employer] service CC: Problem walking Living at home discharge from hospital , using cane or wheelchair Pt need help with dressing and bathing, done by daughter in law She is unable to climb steps.  She does not drive.  She uses a walker to ambulate. She is accompanied by her son. No pain complaints No falls. Pain Inventory Average Pain 0 Pain Right Now 0 My pain is  no pain  LOCATION OF PAIN  no pain  BOWEL Number of stools per week: 7   BLADDER Normal    Mobility use a walker how many minutes can you walk? 1 ability to climb steps?  no do you drive?  no Do you have any goals in this area?  yes  Function disabled: date disabled . I need assistance with the following:  dressing, bathing, meal prep, household duties, and shopping  Neuro/Psych No problems in this area  Prior Studies Any changes since last visit?  no  Physicians involved in your care Any changes since last visit?  no   History reviewed. No pertinent family history. Social History   Socioeconomic History   Marital status: Widowed    Spouse name: Not on file   Number of children: Not on file   Years of education: Not on file   Highest education level: Not on file  Occupational History   Not on file  Tobacco Use   Smoking status: Never   Smokeless  tobacco: Never  Vaping Use   Vaping Use: Never used  Substance and Sexual Activity   Alcohol use: Not Currently   Drug use: Not Currently   Sexual activity: Not on file  Other Topics Concern   Not on file  Social History Narrative   Not on file   Social Determinants of Health   Financial Resource Strain: Not on file  Food Insecurity: Not on file  Transportation Needs: Not on file  Physical Activity: Not on file  Stress: Not on file  Social Connections: Not on file   History reviewed. No pertinent surgical history. Past Medical History:  Diagnosis Date   Controlled type 2 diabetes mellitus without complication, without long-term current use of insulin ([facility]) 03/17/2022   HTN (hypertension) 03/17/2022   Hyperlipidemia 03/17/2022   BP (!) 166/69   Pulse 68   Ht 4\' 11"  (1.499 m)   Wt 136 lb 9.6 oz (62 kg)   SpO2 96%   BMI 27.59 kg/m   Opioid Risk Score:   Fall Risk Score:  `1  Depression screen PHQ 2/9     09/22/2022    2:18 PM 05/20/2022    3:26 PM  Depression screen PHQ 2/9  Decreased Interest 0 1  Down, Depressed, Hopeless 0 1  PHQ - 2 Score 0 2  Altered sleeping  3  Tired, decreased  energy  1  Change in appetite  3  Feeling bad or failure about yourself   0  Trouble concentrating  1  Moving slowly or fidgety/restless  2  Suicidal thoughts  0  PHQ-9 Score  12      Review of Systems  All other systems reviewed and are negative.     Objective:   Physical Exam Vitals and nursing note reviewed.  Constitutional:      Appearance: She is normal weight.  HENT:     Head: Normocephalic and atraumatic.  Eyes:     Extraocular Movements: Extraocular movements intact.     Conjunctiva/sclera: Conjunctivae normal.     Pupils: Pupils are equal, round, and reactive to light.  Musculoskeletal:     Right lower leg: No edema.     Left lower leg: No edema.     Comments: No pain with upper extremity or lower extremity range of motion  Skin:    General: Skin is warm and  dry.  Neurological:     Mental Status: She is alert and oriented to person, place, and time.  Psychiatric:        Mood and Affect: Mood normal.        Behavior: Behavior normal.   Motor strength Right upper extremity 4 - at the deltoid, bicep, tricep, grip, hip flexor, knee extensor, ankle dorsiflexor. Sensation is equal to light touch bilateral upper and lower limbs Sit to stand with supervision assistance  Mild dysmetria with right finger-nose-finger      Assessment & Plan:   #1.  Left basal ganglial bleed likely hypertensive.  We discussed maintenance of normal blood pressure.  We discussed the importance of following up with primary care as well as being compliant with her medications.  The patient also has neurology follow-up later this month She has plateaued in her functional recovery but has had overall good progress.  Do not think she needs any further physical medicine rehab follow-up at this time.

## 2022-10-12 ENCOUNTER — Ambulatory Visit: Payer: Medicaid Other | Admitting: Adult Health

## 2022-10-13 ENCOUNTER — Ambulatory Visit: Payer: Medicaid Other | Admitting: Adult Health

## 2022-10-14 ENCOUNTER — Encounter: Payer: Self-pay | Admitting: Adult Health

## 2022-10-14 ENCOUNTER — Ambulatory Visit (INDEPENDENT_AMBULATORY_CARE_PROVIDER_SITE_OTHER): Payer: Medicaid Other | Admitting: Adult Health

## 2022-10-14 VITALS — BP 154/91 | HR 64 | Ht 59.0 in | Wt 136.0 lb

## 2022-10-14 DIAGNOSIS — I610 Nontraumatic intracerebral hemorrhage in hemisphere, subcortical: Secondary | ICD-10-CM

## 2022-10-14 NOTE — Progress Notes (Signed)
PATIENT: Paula Combs DOB: 14-Jun-1961  REASON FOR VISIT: follow up HISTORY FROM: patient PRIMARY NEUROLOGIST:  Chief Complaint  Patient presents with   Rm 19    Here with son-in-law for follow-up. States pt is about the same, doing ok, maybe a little better. Interpreter on phone speaking language [name], [place] [id number] name [place] Dec.    HISTORY OF PRESENT ILLNESS: Today 10/14/22  Paula Combs is a 61 y.o. female who has been followed in this office for left basal ganglia Intracranial hemorrhage. Returns today for follow-up.  Patient is here with her son-in-law.  Used interpreter language line.  Patient reports she has not had any additional strokelike symptoms.  She is living in her own residence.  She is her primary care to manage risk factors such as hypertension, diabetes and hyperlipidemia.  Returns today for evaluation.  HISTORY Paula Combs is a 61 year old female, accompanied by her Involved seen in request by Dr. Marvel Plan, to follow-up hospital discharge for intracranial hemorrhage, she is a native of [name], does not speak English, history is from her son, primary care is at [facility],    I reviewed and summarized the referring note.PMHX. Hyperlipidemia Hypertension Diabetes   Presented to hospital on March 14, 2022 for confusion, dysarthria, right facial droop, mild elevated blood pressure upon presentation 163/99  Personally reviewed CT head without contrast, left basal ganglia acute intraparenchymal hemorrhage, with surrounding mild edema without significant mass effect, measuring 1.9 x 1.5 x 2.0 cm,   Echocardiogram normal ejection fraction no significant abnormalities. CT angiogram of head and neck showed no significant large vessel disease, evidence of intracranial atherosclerotic disease  Ultrasound of abdomen showed fatty liver  Swallowing study showed moderate oral dysphagia,  Laboratory evaluations, CBC slight decrease hemoglobin 11.4, lipid panel LDL 153,  triglyceride 275, cholesterol 246, A1c 5.7, negative HIV,   She is now back back home after rehabilitation, prior to hospital admission, she was able to help family chore, not sit down most of the time, not functioning well, can still feed herself, ambulate with a walker,  REVIEW OF SYSTEMS: Out of a complete 14 system review of symptoms, the patient complains only of the following symptoms, and all other reviewed systems are negative.  ALLERGIES: No Known Allergies  HOME MEDICATIONS: Outpatient Medications Prior to Visit  Medication Sig Dispense Refill   alendronate (FOSAMAX) 70 MG tablet Take 70 mg by mouth once a week.     atorvastatin (LIPITOR) 40 MG tablet Take 40 mg by mouth daily.     Brinzolamide-Brimonidine (SIMBRINZA) 1-0.2 % SUSP SHAKE LIQUID AND INSTILL 1 DROP IN BOTH EYES THREE TIMES DAILY     irbesartan (AVAPRO) 150 MG tablet Take 150 mg by mouth daily.     irbesartan (AVAPRO) 75 MG tablet TAKE 1 TABLET(75 MG) BY MOUTH DAILY 30 tablet 0   latanoprost (XALATAN) 0.005 % ophthalmic solution SMARTSIG:In Eye(s)     metFORMIN (GLUCOPHAGE) 1000 MG tablet Take 1,000 mg by mouth 2 (two) times daily.     sitaGLIPtin (JANUVIA) 100 MG tablet Take 100 mg by mouth daily.     timolol (TIMOPTIC) 0.5 % ophthalmic solution SMARTSIG:In Eye(s)     omeprazole (PRILOSEC) 40 MG capsule Take 40 mg by mouth daily.     No facility-administered medications prior to visit.    PAST MEDICAL HISTORY: Past Medical History:  Diagnosis Date   Controlled type 2 diabetes mellitus without complication, without long-term current use of insulin (HCC) 03/17/2022  HTN (hypertension) 03/17/2022   Hyperlipidemia 03/17/2022    PAST SURGICAL HISTORY: No past surgical history on file.  FAMILY HISTORY: No family history on file.  SOCIAL HISTORY: Social History   Socioeconomic History   Marital status: Widowed    Spouse name: Not on file   Number of children: Not on file   Years of education: Not on file    Highest education level: Not on file  Occupational History   Not on file  Tobacco Use   Smoking status: Never   Smokeless tobacco: Never  Vaping Use   Vaping Use: Never used  Substance and Sexual Activity   Alcohol use: Not Currently   Drug use: Not Currently   Sexual activity: Not on file  Other Topics Concern   Not on file  Social History Narrative   Not on file   Social Determinants of Health   Financial Resource Strain: Not on file  Food Insecurity: Not on file  Transportation Needs: Not on file  Physical Activity: Not on file  Stress: Not on file  Social Connections: Not on file  Intimate Partner Violence: Not on file      PHYSICAL EXAM  Vitals:   10/14/22 1336  BP: (!) 154/91  Pulse: 64  Weight: 136 lb (61.7 kg)  Height: 4\' 11"  (1.499 m)   Body mass index is 27.47 kg/m.  Generalized: Well developed, in no acute distress   Neurological examination  Mentation: Alert oriented to time, place, history taking. Follows all commands Cranial nerve II-XII: Pupils were equal round reactive to light.blind right eye. Facial sensation and strength were normal. Head turning and shoulder shrug  were normal and symmetric. Motor: The motor testing reveals 5 over 5 strength of all 4 extremities. Weakness noted in the right ankle. Sensory: Sensory testing is intact to soft touch on all 4 extremities. No evidence of extinction is noted.  Coordination: Cerebellar testing reveals good finger-nose-finger and heel-to-shin bilaterally.  Gait and station: Uses a walker when ambulating. Reflexes: Deep tendon reflexes are symmetric and normal bilaterally.   DIAGNOSTIC DATA (LABS, IMAGING, TESTING) - I reviewed patient records, labs, notes, testing and imaging myself where available.  Lab Results  Component Value Date   WBC 7.2 03/25/2022   HGB 12.5 03/25/2022   HCT 38.1 03/25/2022   MCV 94.1 03/25/2022   PLT 327 03/25/2022      Component Value Date/Time   NA 138 03/25/2022  0523   K 4.0 03/25/2022 0523   CL 108 03/25/2022 0523   CO2 22 03/25/2022 0523   GLUCOSE 118 (H) 03/25/2022 0523   BUN 23 03/25/2022 0523   CREATININE 0.94 03/25/2022 0523   CALCIUM 9.5 03/25/2022 0523   PROT 7.4 03/19/2022 0504   ALBUMIN 3.3 (L) 03/19/2022 0504   AST 34 03/19/2022 0504   ALT 41 03/19/2022 0504   ALKPHOS 80 03/19/2022 0504   BILITOT 0.8 03/19/2022 0504   GFRNONAA >60 03/25/2022 0523   Lab Results  Component Value Date   CHOL 246 (H) 03/15/2022   HDL 38 (L) 03/15/2022   LDLCALC 153 (H) 03/15/2022   TRIG 113 03/17/2022   CHOLHDL 6.5 03/15/2022   Lab Results  Component Value Date   HGBA1C 5.7 (H) 03/14/2022     ASSESSMENT AND PLAN 61 y.o. year old female  has a past medical history of Controlled type 2 diabetes mellitus without complication, without long-term current use of insulin (HCC) (03/17/2022), HTN (hypertension) (03/17/2022), and Hyperlipidemia (03/17/2022). here with:  Nontraumatic  subcortical hemorrhage of left cerebral hemisphere  Continue to modify risk factors with goal blood HTN: BP goal <160 HLD: LDL goal <70.  DMII: A1c goal<7.0.  Encouraged patient to monitor diet and encouraged exercise Continue regular follow-up with PCP FU with our office PRN       Butch Penny, MSN, NP-C 10/14/2022, 1:35 PM [facility] [address], [address] [place], [place] [address] [phone]

## 2023-04-09 ENCOUNTER — Other Ambulatory Visit: Payer: Self-pay

## 2023-04-09 ENCOUNTER — Emergency Department (HOSPITAL_BASED_OUTPATIENT_CLINIC_OR_DEPARTMENT_OTHER)
Admission: EM | Admit: 2023-04-09 | Discharge: 2023-04-09 | Disposition: A | Discharge status: Home / Self Care | Payer: Medicaid Other | Attending: Emergency Medicine | Admitting: Emergency Medicine

## 2023-04-09 ENCOUNTER — Encounter (HOSPITAL_BASED_OUTPATIENT_CLINIC_OR_DEPARTMENT_OTHER): Payer: Self-pay | Admitting: Emergency Medicine

## 2023-04-09 DIAGNOSIS — R220 Localized swelling, mass and lump, head: Secondary | ICD-10-CM | POA: Diagnosis present

## 2023-04-09 DIAGNOSIS — Z87891 Personal history of nicotine dependence: Secondary | ICD-10-CM | POA: Insufficient documentation

## 2023-04-09 DIAGNOSIS — Z79899 Other long term (current) drug therapy: Secondary | ICD-10-CM | POA: Insufficient documentation

## 2023-04-09 DIAGNOSIS — I10 Essential (primary) hypertension: Secondary | ICD-10-CM | POA: Insufficient documentation

## 2023-04-09 DIAGNOSIS — L309 Dermatitis, unspecified: Secondary | ICD-10-CM

## 2023-04-09 DIAGNOSIS — E119 Type 2 diabetes mellitus without complications: Secondary | ICD-10-CM | POA: Insufficient documentation

## 2023-04-09 DIAGNOSIS — Z7984 Long term (current) use of oral hypoglycemic drugs: Secondary | ICD-10-CM | POA: Insufficient documentation

## 2023-04-09 MED ORDER — HYDROXYZINE HCL 25 MG PO TABS
25.0000 mg | ORAL_TABLET | Freq: Once | ORAL | Status: AC
  Administered 2023-04-09: 25 mg via ORAL
  Filled 2023-04-09: qty 1

## 2023-04-09 MED ORDER — HYDROXYZINE HCL 25 MG PO TABS
25.0000 mg | ORAL_TABLET | Freq: Four times a day (QID) | ORAL | 0 refills | Status: AC | PRN
Start: 2023-04-09 — End: ?

## 2023-04-09 MED ORDER — TRIAMCINOLONE ACETONIDE 40 MG/ML IJ SUSP
60.0000 mg | Freq: Once | INTRAMUSCULAR | Status: AC
  Administered 2023-04-09: 60 mg via INTRAMUSCULAR
  Filled 2023-04-09: qty 5

## 2023-04-09 NOTE — ED Provider Notes (Signed)
[facility] Provider Note: Paula Dell, MD, FACEP  CSN: [id number] MRN: [id number] ARRIVAL: 04/09/23 at 0518 ROOM: MH09/[facility]09   CHIEF COMPLAINT  Facial Swelling  Professional [name] telemetry interpreter used HISTORY OF PRESENT ILLNESS  04/09/23 5:43 AM Paula Combs is a 62 y.o. female who is a [place] immigrant.  She is here with some swelling and a vesicular rash of her face that began yesterday and worsened overnight.  It is also pruritic.  She has also had some spread of the rash to her upper chest as well as her wrists.  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  She denies pain.  She denies shortness of breath.  She denies vomiting or diarrhea.  She has not been exposed to any plants or other substances topically.  Her family member suspects she may have eaten something to trigger this but she has no known food allergies.   Past Medical History:  Diagnosis Date   Controlled type 2 diabetes mellitus without complication, without long-term current use of insulin (HCC) 03/17/2022   HTN (hypertension) 03/17/2022   Hyperlipidemia 03/17/2022    History reviewed. No pertinent surgical history.  History reviewed. No pertinent family history.  Social History   Tobacco Use   Smoking status: Former   Smokeless tobacco: Never   Tobacco comments:    Smoked tobacco leaf back in [place] 50-60 years ago   Vaping Use   Vaping Use: Never used  Substance Use Topics   Alcohol use: Not Currently   Drug use: Not Currently    Prior to Admission medications   Medication Sig Start Date End Date Taking? Authorizing Provider  hydrOXYzine (ATARAX) 25 MG tablet Take 1 tablet (25 mg total) by mouth every 6 (six) hours as needed for itching (may cause drowsiness). 04/09/23  Yes Moira Umholtz, MD  alendronate (FOSAMAX) 70 MG tablet Take 70 mg by mouth once a week. 07/12/22   [provider]  atorvastatin (LIPITOR) 40 MG tablet  Take 40 mg by mouth daily. 05/01/22   [provider]  Brinzolamide-Brimonidine (SIMBRINZA) 1-0.2 % SUSP SHAKE LIQUID AND INSTILL 1 DROP IN BOTH EYES THREE TIMES DAILY 04/16/22   [provider]  irbesartan (AVAPRO) 150 MG tablet Take 150 mg by mouth daily. 05/25/22   [provider]  irbesartan (AVAPRO) 75 MG tablet TAKE 1 TABLET(75 MG) BY MOUTH DAILY 04/24/22   Kirsteins, Victorino Sparrow, MD  latanoprost (XALATAN) 0.005 % ophthalmic solution SMARTSIG:In Eye(s) 06/11/22   [provider]  metFORMIN (GLUCOPHAGE) 1000 MG tablet Take 1,000 mg by mouth 2 (two) times daily. 05/01/22   [provider]  omeprazole (PRILOSEC) 40 MG capsule Take 40 mg by mouth daily. 03/28/22   [provider]  sitaGLIPtin (JANUVIA) 100 MG tablet Take 100 mg by mouth daily.    [provider]  timolol (TIMOPTIC) 0.5 % ophthalmic solution SMARTSIG:In Eye(s) 10/14/22   [provider]    Allergies Patient has no known allergies.   REVIEW OF SYSTEMS  Negative except as noted here or in the History of Present Illness.   PHYSICAL EXAMINATION  Initial Vital Signs Blood pressure (!) 183/99, pulse 79, temperature 98 F (36.7 C), temperature source Oral, resp. rate 16, height 4\' 11"  (1.499 m), weight 61 kg, SpO2 99 %.  Examination General: Well-developed, well-nourished female in no acute distress; appearance consistent with age of record HENT: normocephalic; atraumatic; edema and vesicular  rash of face and upper chest:    Eyes: Left eye appears normal; right eye with corneal opacity Neck: supple Heart: regular rate and rhythm Lungs: clear to auscultation bilaterally Abdomen: soft; nondistended; nontender; bowel sounds present Extremities: No deformity; full range of motion; pulses normal Neurologic: Awake, alert; motor function intact in all extremities and symmetric; no facial droop Skin: Warm and dry; vesicular rash of upper extremities, notably the right  wrist:    Psychiatric: Flat affect   RESULTS  Summary of this visit's results, reviewed and interpreted by myself:   EKG Interpretation  Date/Time:    Ventricular Rate:    PR Interval:    QRS Duration:   QT Interval:    QTC Calculation:   R Axis:     Text Interpretation:         Laboratory Studies: No results found for this or any previous visit (from the past 24 hour(s)). Imaging Studies: No results found.  ED COURSE and MDM  Nursing notes, initial and subsequent vitals signs, including pulse oximetry, reviewed and interpreted by myself.  Vitals:   04/09/23 0531 04/09/23 0532 04/09/23 0540 04/09/23 0541  BP: (!) 183/99     Pulse: 79  72   Resp: 16     Temp:    98 F (36.7 C)  TempSrc:    Oral  SpO2: 99%  98%   Weight:  61 kg    Height:  4\' 11"  (1.499 m)     Medications  triamcinolone acetonide (KENALOG-40) injection 60 mg (has no administration in time range)  hydrOXYzine (ATARAX) tablet 25 mg (has no administration in time range)    The patient's swelling and rash appear consistent with an acute dermatitis.  It could be contact dermatitis but there is no report of exposure to for example, poison oak or other topical irritants.  This would be an unusual presentation for a food allergy.  Although she is diabetic I believe she would benefit from steroids (IM triamcinolone).  She and her family member were advised, via interpreter, that the steroids may cause her diabetes to become poorly controlled.  They will need to monitor her blood sugars closely and follow-up promptly with her PCP at [facility].  They should call today to arrange a follow-up appointment.  PROCEDURES  Procedures   ED DIAGNOSES     ICD-10-CM   1. Dermatitis  L30.9          Nakenya Theall, Jonny Ruiz, MD 04/09/23 [id number]

## 2023-04-09 NOTE — ED Triage Notes (Signed)
Patient from home accompanied with family. With use of interpreter, patient has facial swelling that started yesterday. Denies exposure to any allergens. Unsure of any known allergies, but thinking that food has caused this. Denies changes to any medications . Denies pain but endorses itching. Denies shortness of breath. Denies n/v/d.  Rash noted to the chest and bilateral arms.

## 2023-05-24 IMAGING — CT CT HEAD W/O CM
4 series · 16 of 47 positions shown, 18 images · non-contrast
Comparison: 03/14/2022

CLINICAL DATA: Intracranial hemorrhage



[Series 3: head without · axial · non-contrast · 0.40mm/px · z∈[-107,+18]mm · 7 of 35 slices shown, 9 images]
[im 5/35  brain]
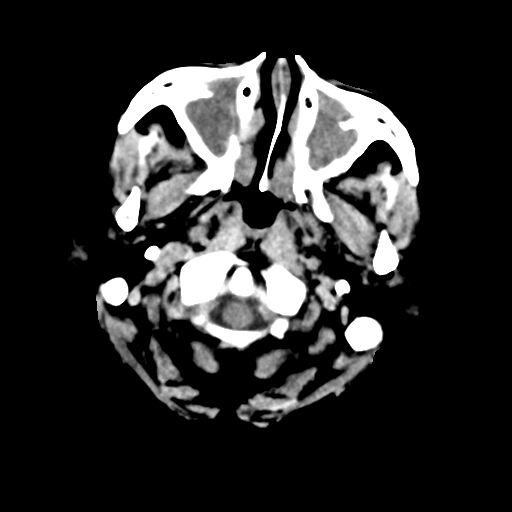
[im 5/35  bone]
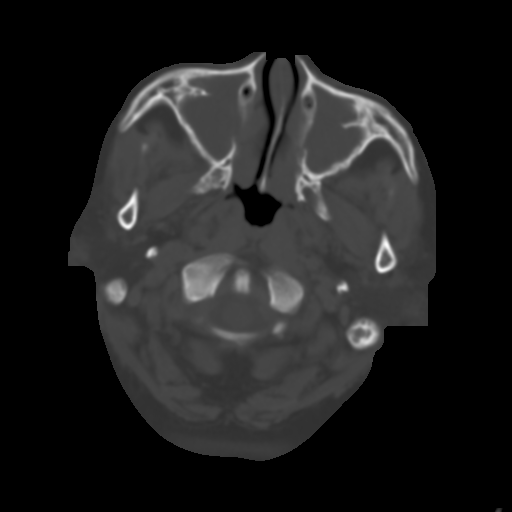
[im 9/35  brain]
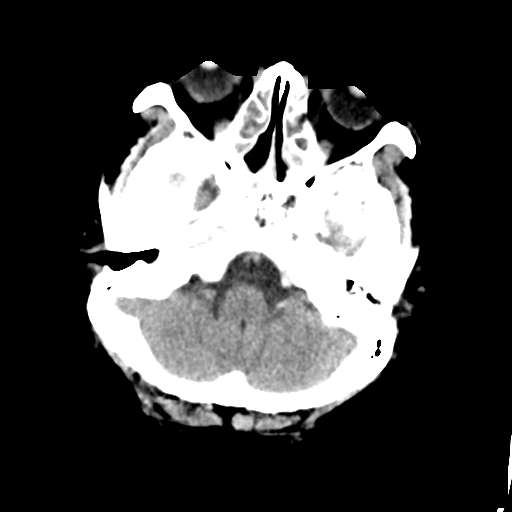
[im 13/35  brain]
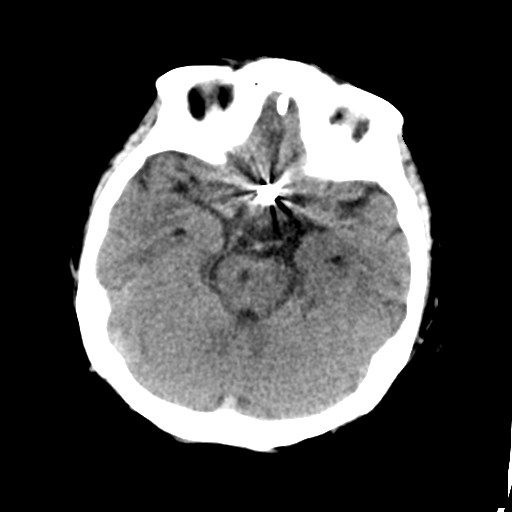
[im 18/35  brain]
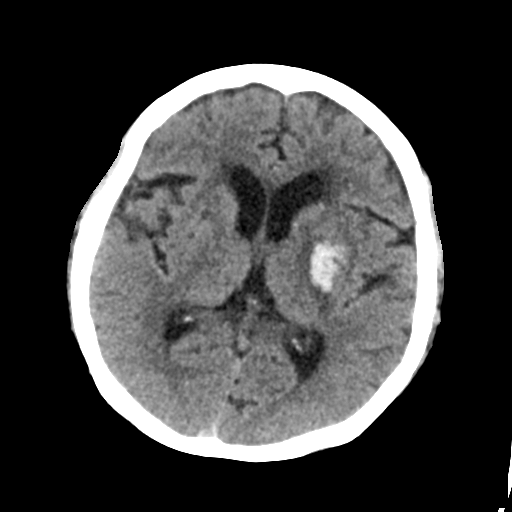
[im 22/35  brain]
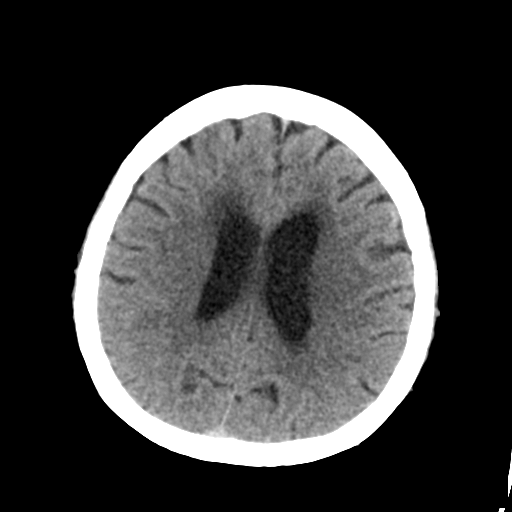
[im 22/35  bone]
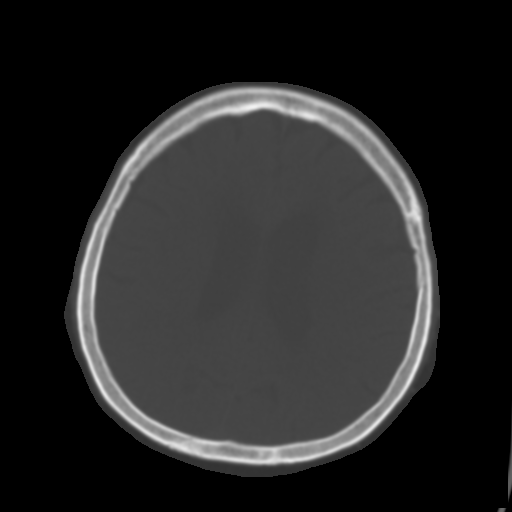
[im 26/35  brain]
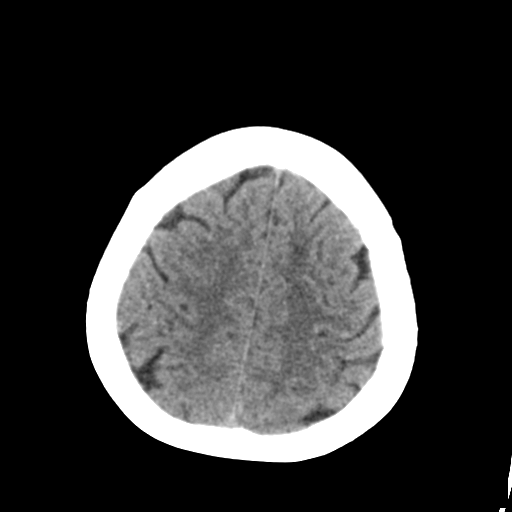
[im 30/35  brain]
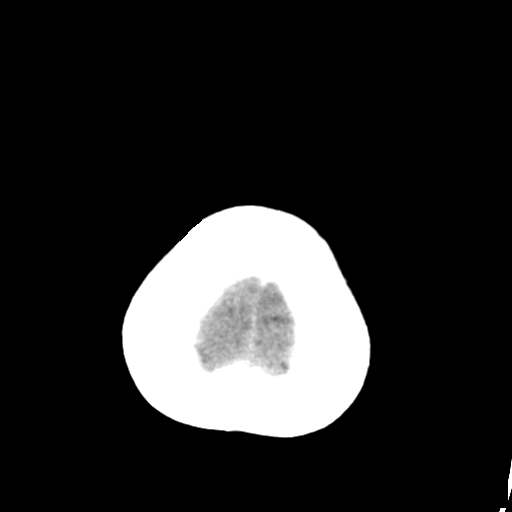

[Series 4: head bone · axial · 0.40mm/px · z∈[-111,-77]mm · 3 of 86 slices shown]
[im 9/86  bone]
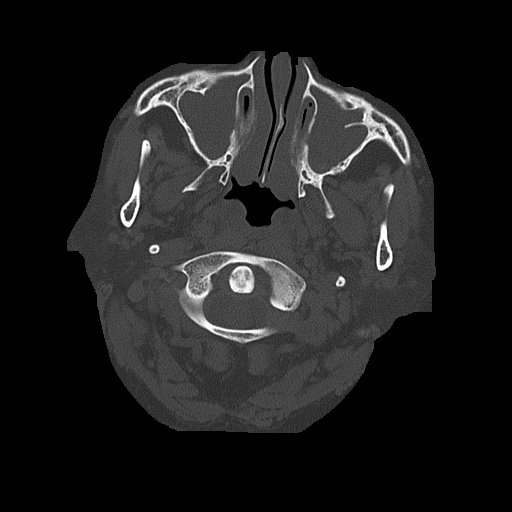
[im 18/86  bone]
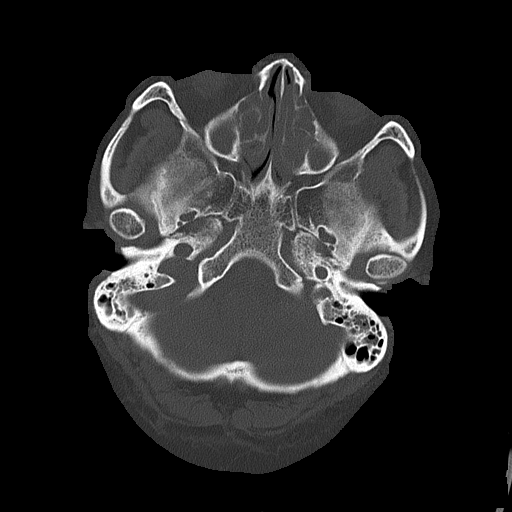
[im 26/86  bone]
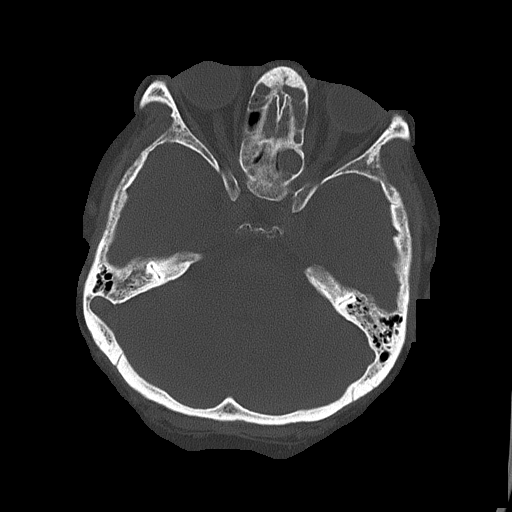

[Series 5: head without cor · coronal · non-contrast · 0.33mm/px · 3 of 67 slices shown]
[im 23/67  brain]
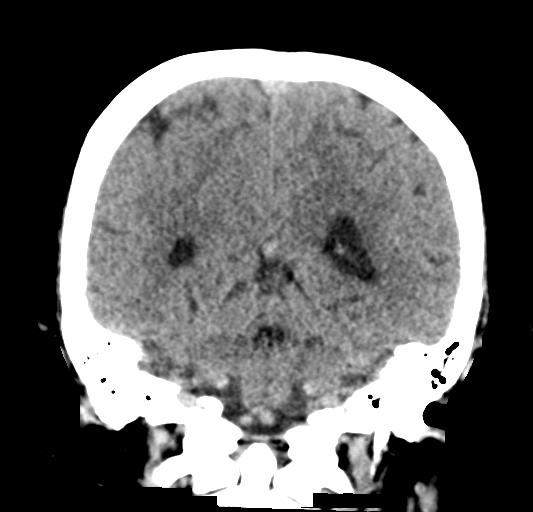
[im 30/67  brain]
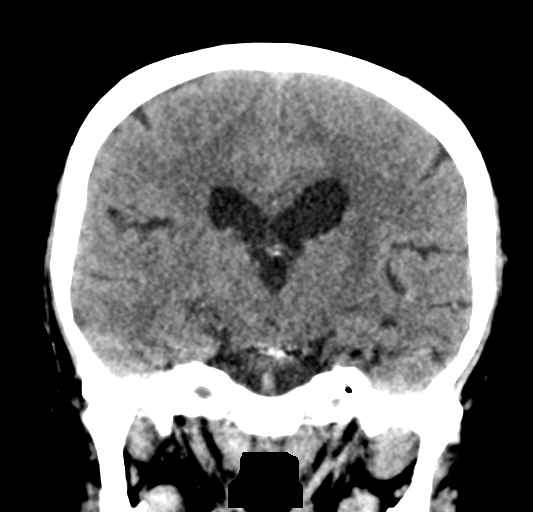
[im 37/67  brain]
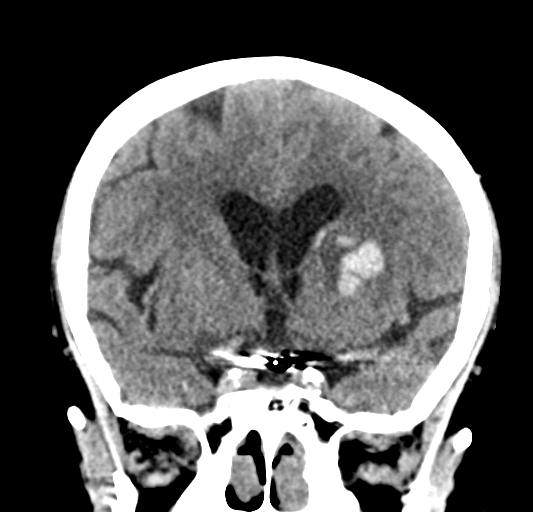

[Series 6: head without sag · sagittal · non-contrast · 0.33mm/px · 3 of 59 slices shown]
[im 20/59  brain]
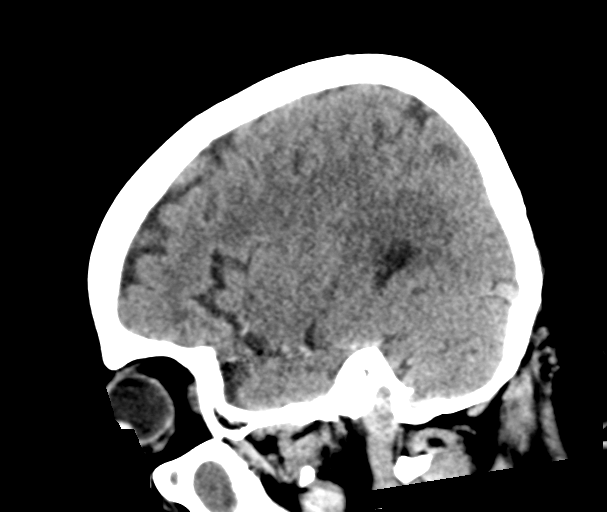
[im 30/59  brain]
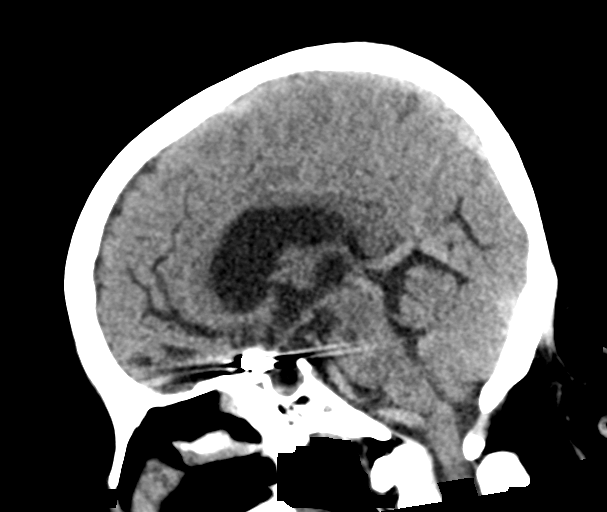
[im 39/59  brain]
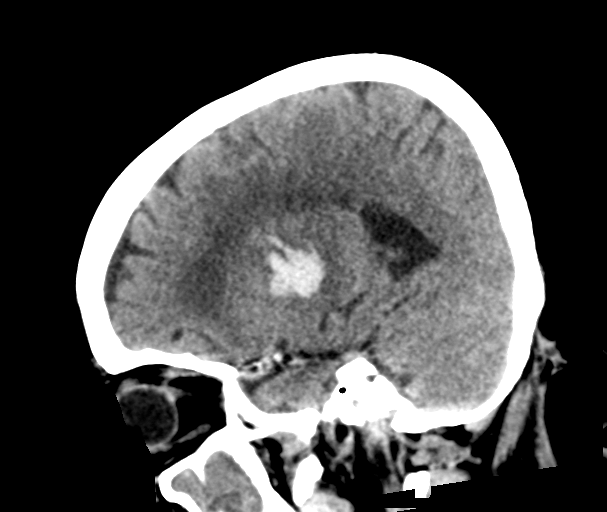

[16 of 47 positions shown; findings below may reference images not displayed]

FINDINGS: Brain: Redemonstrated hyperdense hemorrhage centered in the left
lentiform nucleus, measuring 1.9 x 1.5 x 2.0 cm (AP x TR x CC)
(series 3, image 19 and series 5, image 31), previously 2.0 x 1.5 x
1.9 cm overall unchanged. Again noted is ill-defined hemorrhage
tracking towards the caudate. Unchanged mild surrounding edema,
without evidence of intraventricular extension, significant mass
effect, or midline shift.

No acute infarct, mass, hydrocephalus, or extra-axial collection.
Periventricular white matter changes, likely the sequela of chronic
small vessel ischemic disease.

Vascular: Prior A-comm aneurysm coiling.  No hyperdense vessel.

Skull: No acute osseous abnormality.

Sinuses/Orbits: Near-complete opacification of the entirety of the
paranasal sinuses with osseous thickening, compatible with chronic
sinusitis. The orbits are unremarkable.

Other: Fluid in bilateral mastoid air cells.
IMPRESSION: Unchanged acute intraparenchymal hemorrhage involving the left basal
ganglia, with unchanged surrounding mild edema without significant
mass effect or midline shift. No intraventricular extension.
